# Patient Record
Sex: Male | Born: 1955 | Race: White | Hispanic: No | Marital: Married | Smoking: Never smoker
Health system: Southern US, Community
[De-identification: ages and names within clinical notes are randomized; demographics above are authoritative.]

## PROBLEM LIST (undated history)

## (undated) DIAGNOSIS — F329 Major depressive disorder, single episode, unspecified: Secondary | ICD-10-CM

## (undated) DIAGNOSIS — F429 Obsessive-compulsive disorder, unspecified: Secondary | ICD-10-CM

## (undated) DIAGNOSIS — N4281 Prostatodynia syndrome: Secondary | ICD-10-CM

## (undated) DIAGNOSIS — F419 Anxiety disorder, unspecified: Secondary | ICD-10-CM

## (undated) DIAGNOSIS — F32A Depression, unspecified: Secondary | ICD-10-CM

## (undated) DIAGNOSIS — K7581 Nonalcoholic steatohepatitis (NASH): Secondary | ICD-10-CM

## (undated) DIAGNOSIS — M6281 Muscle weakness (generalized): Secondary | ICD-10-CM

## (undated) DIAGNOSIS — M5416 Radiculopathy, lumbar region: Secondary | ICD-10-CM

## (undated) DIAGNOSIS — G252 Other specified forms of tremor: Secondary | ICD-10-CM

## (undated) DIAGNOSIS — R400 Somnolence: Secondary | ICD-10-CM

## (undated) HISTORY — DX: Nonalcoholic steatohepatitis (NASH): K75.81

## (undated) HISTORY — DX: Obsessive-compulsive disorder, unspecified: F42.9

## (undated) HISTORY — DX: Major depressive disorder, single episode, unspecified: F32.9

## (undated) HISTORY — DX: Depression, unspecified: F32.A

## (undated) HISTORY — DX: Other specified forms of tremor: G25.2

## (undated) HISTORY — DX: Muscle weakness (generalized): M62.81

## (undated) HISTORY — PX: APPENDECTOMY: SHX54

## (undated) HISTORY — DX: Radiculopathy, lumbar region: M54.16

## (undated) HISTORY — DX: Prostatodynia syndrome: N42.81

## (undated) HISTORY — DX: Anxiety disorder, unspecified: F41.9

## (undated) HISTORY — DX: Somnolence: R40.0

## (undated) HISTORY — PX: TONSILLECTOMY AND ADENOIDECTOMY: SUR1326

---

## 1996-03-26 DIAGNOSIS — F429 Obsessive-compulsive disorder, unspecified: Secondary | ICD-10-CM

## 1996-03-26 HISTORY — DX: Obsessive-compulsive disorder, unspecified: F42.9

## 2005-04-05 ENCOUNTER — Ambulatory Visit: Payer: Self-pay

## 2013-09-27 ENCOUNTER — Ambulatory Visit (INDEPENDENT_AMBULATORY_CARE_PROVIDER_SITE_OTHER): Payer: BC Managed Care – PPO | Admitting: Physician Assistant

## 2013-09-27 VITALS — BP 152/92 | HR 100 | Temp 98.1°F | Resp 20 | Ht 67.0 in | Wt 226.8 lb

## 2013-09-27 DIAGNOSIS — H60391 Other infective otitis externa, right ear: Secondary | ICD-10-CM

## 2013-09-27 DIAGNOSIS — R059 Cough, unspecified: Secondary | ICD-10-CM

## 2013-09-27 DIAGNOSIS — H60399 Other infective otitis externa, unspecified ear: Secondary | ICD-10-CM

## 2013-09-27 DIAGNOSIS — F419 Anxiety disorder, unspecified: Secondary | ICD-10-CM

## 2013-09-27 DIAGNOSIS — H6123 Impacted cerumen, bilateral: Secondary | ICD-10-CM

## 2013-09-27 DIAGNOSIS — F329 Major depressive disorder, single episode, unspecified: Secondary | ICD-10-CM | POA: Insufficient documentation

## 2013-09-27 DIAGNOSIS — H6120 Impacted cerumen, unspecified ear: Secondary | ICD-10-CM

## 2013-09-27 DIAGNOSIS — R05 Cough: Secondary | ICD-10-CM

## 2013-09-27 MED ORDER — CIPROFLOXACIN-HYDROCORTISONE 0.2-1 % OT SUSP: 3.0000 [drp] | Freq: Two times a day (BID) | OTIC | Status: AC

## 2013-09-27 MED ORDER — AMOXICILLIN-POT CLAVULANATE 875-125 MG PO TABS
1.0000 | ORAL_TABLET | Freq: Two times a day (BID) | ORAL | Status: AC
Start: 2013-09-27 — End: 2013-10-07

## 2013-09-27 NOTE — Patient Instructions (Signed)
Get plenty of rest and drink at least 64 ounces of water daily. 

## 2013-09-27 NOTE — Progress Notes (Signed)
After verbal consent, colace drops placed in patients left ear, waited 5 minutes. Left ear flushed with peroxide and warm water at 1:1 ratio. Large amount of cerumen removed. Then patients right ear was gently flushed with peroxide and warm water 1:1 ratio. Small amount of white material, and cerumen removed. Patient tolerated procedure well without complaints of pain. Minimal discomfort noted of right ear when using otoscope to check progress of procedure. Patient reported feeling better following procedure, states hearing increased in left ear. Right ear appears red within ear canal.

## 2013-09-27 NOTE — Progress Notes (Signed)
Subjective:    Patient ID: Mark Estrada, male    DOB: 02/18/1956, 58 y.o.   MRN: [id number]   PCP: Dois DavenportICHTER,KAREN L., MD  Chief Complaint  Patient presents with  . Otalgia    right ear is bothering him, has been sick with a cold  . Cough    productive    Medications, allergies, past medical history, surgical history, family history, social history and problem list reviewed and updated.  Patient Active Problem List   Diagnosis Date Noted  . Anxiety and depression 09/27/2013   Prior to Admission medications   Medication Sig Start Date End Date Taking? Authorizing Provider  PARoxetine (PAXIL) 40 MG tablet Take 40 mg by mouth every morning.   Yes Historical Provider, MD    HPI  Started as a chest cold 2-3 weeks ago.   Still coughing up some stuff, but overall is feeling better.  May have had some fever initially, but not since the first day or two. "I assume it's settled into my ear, which it always does." He reports long history of ear issues-cerumen impaction and infections. About the time the chest cold started, he scratched the inside of the ear canal and his fingernail cut the skin. Tried cleaning out the canal with H2O2 a couple of days ago, and notes that he's still having the liquid drain out after 2 days.  Review of Systems     Objective:   Physical Exam  Vitals reviewed. Constitutional: He is oriented to person, place, and time. Vital signs are normal. He appears well-developed and well-nourished. He is active and cooperative. No distress.  BP 152/92  Pulse 100  Temp(Src) 98.1 F (36.7 C) (Oral)  Resp 20  Ht 5\' 7"  (1.702 m)  Wt 226 lb 12.8 oz (102.876 kg)  BMI 35.51 kg/m2  SpO2 96%  HENT:  Head: Normocephalic and atraumatic.  Right Ear: Hearing normal.  Left Ear: Hearing normal.  LEFT ear with cerumen impaction-very dry, hard cerumen fills the canal and completely obscures visualization of the TM.  After irrigation, the canal is clear and TM is  normal in appearance.  RIGHT ear is swollen on inspection, and the external ear is tender.  The canal is swollen and the TM is obscured by soft, yellow cerumen.  Gentle irrigation removed most of the cerumen, revealing erythema and edema of the canal, and a normal TM.  Eyes: Conjunctivae are normal. No scleral icterus.  Neck: Normal range of motion. Neck supple. No thyromegaly present.  Cardiovascular: Normal rate, regular rhythm and normal heart sounds.   Pulses:      Radial pulses are 2+ on the right side, and 2+ on the left side.  Pulmonary/Chest: Effort normal and breath sounds normal.  Lymphadenopathy:       Head (right side): No tonsillar, no preauricular, no posterior auricular and no occipital adenopathy present.       Head (left side): No tonsillar, no preauricular, no posterior auricular and no occipital adenopathy present.    He has no cervical adenopathy.       Right: No supraclavicular adenopathy present.       Left: No supraclavicular adenopathy present.  Neurological: He is alert and oriented to person, place, and time. No sensory deficit.  Skin: Skin is warm, dry and intact. No rash noted. No cyanosis or erythema. Nails show no clubbing.  Psychiatric: He has a normal mood and affect.          Assessment & Plan:  1. Otitis, externa, infective, right Anticipatory guidance provided. RTC if symptoms worsen/persist. - amoxicillin-clavulanate (AUGMENTIN) 875-125 MG per tablet; Take 1 tablet by mouth 2 (two) times daily.  Dispense: 20 tablet; Refill: 0 - ciprofloxacin-hydrocortisone (CIPRO HC OTIC) otic suspension; Place 3 drops into the right ear 2 (two) times daily.  Dispense: 10 mL; Refill: 0  2. Cough Declines treatment.  Mucinex, rest, fluids.  3. Cerumen impaction, bilateral Resolved with irrigation.   Fernande Brashelle S. Abid Bolla, PA-C Physician Assistant-Certified [facility]

## 2013-09-30 NOTE — Addendum Note (Signed)
Addended by: Fernande BrasJEFFERY, Chella Chapdelaine S on: 09/30/2013 08:11 PM   Modules accepted: Level of Service

## 2014-08-01 ENCOUNTER — Ambulatory Visit (INDEPENDENT_AMBULATORY_CARE_PROVIDER_SITE_OTHER): Payer: BC Managed Care – PPO | Admitting: Internal Medicine

## 2014-08-01 VITALS — BP 140/70 | HR 100 | Temp 97.7°F | Resp 17 | Ht 67.0 in | Wt 229.8 lb

## 2014-08-01 DIAGNOSIS — H60392 Other infective otitis externa, left ear: Secondary | ICD-10-CM

## 2014-08-01 MED ORDER — NEOMYCIN-POLYMYXIN-HC 3.5-10000-1 OT SUSP: 3.0000 [drp] | Freq: Four times a day (QID) | OTIC | Status: DC

## 2014-08-01 NOTE — Progress Notes (Signed)
Subjective:  This chart was scribed for Ellamae Siaobert Asael Pann MD, by Veverly FellsHatice Demirci,scribe, at [facility].  This patient was seen in room 1 and the patient's care was started at 9:55 AM.     Patient ID: Mark Estrada, male    DOB: 06/19/1955, 59 y.o.   MRN: [id number] Chief Complaint  Patient presents with  . Otitis Externa    left ear x 1 week  . Ear Pain    x 1week    HPI HPI Comments: Mark Estrada is a 59 y.o. male who [facility] the [facility] complaining of constant left sided ear pain onset a week ago.  He has had similar symptoms in the past and does not ever get pain in his right ear.  He has not been swimming recently. He has no other complaints today.    Past hx EOM  Patient Active Problem List   Diagnosis Date Noted  . Anxiety and depression 09/27/2013    Past Surgical History  Procedure Laterality Date  . Appendectomy    . Tonsillectomy and adenoidectomy     Allergies  Allergen Reactions  . Erythromycin     Itching and hives    Prior to Admission medications   Medication Sig Start Date End Date Taking? Authorizing Provider  PARoxetine (PAXIL) 40 MG tablet Take 40 mg by mouth every morning.   Yes Historical Provider, MD   History   Social History  . Marital Status: Married    Spouse Name: [name]  . Number of Children: 1  . Years of Education: Master's   Occupational History  . History Teacher     [employer]   Social History Main Topics  . Smoking status: Never Smoker   . Smokeless tobacco: Never Used  . Alcohol Use: No  . Drug Use: No  . Sexual Activity: Not on file   Other Topics Concern  . Not on file   Social History Narrative   Lives with his wife and their daughter.       Review of Systems  Constitutional: Negative for fever and chills.  HENT: Positive for ear pain. Negative for facial swelling and hearing loss.   Respiratory: Negative for cough, choking and shortness of  breath.   Gastrointestinal: Negative for nausea and vomiting.  Neurological: Negative for dizziness.       Objective:   Physical Exam  Constitutional: He is oriented to person, place, and time. He appears well-developed and well-nourished. No distress.  HENT:  Head: Normocephalic and atraumatic.  Left EAC has crusting and redness throughout with tender tragus pull. No local adenopathy.   Eyes: Conjunctivae and EOM are normal. Pupils are equal, round, and reactive to light.  Neck: Neck supple.  Lymphadenopathy:    He has no cervical adenopathy.  Neurological: He is alert and oriented to person, place, and time.  Skin: Skin is warm and dry.  Psychiatric: He has a normal mood and affect. His behavior is normal.  Nursing note and vitals reviewed.  Filed Vitals:   08/01/14 0948  BP: 140/70  Pulse: 100  Temp: 97.7 F (36.5 C)  TempSrc: Oral  Resp: 17  Height: 5\' 7"  (1.702 m)  Weight: 229 lb 12.8 oz (104.237 kg)  SpO2: 97%          Assessment & Plan:  I have completed the patient encounter in its entirety as documented by the scribe, with editing by me where necessary. Evalynne Locurto P.  Merla Richesoolittle, M.D.  Otitis, externa, infective, left  Meds ordered this encounter  Medications  . neomycin-polymyxin-hydrocortisone (CORTISPORIN) 3.5-10000-1 otic suspension    Sig: Place 3 drops into the left ear 4 (four) times daily.    Dispense:  10 mL    Refill:  2   Consider prophyl min oil weekly, or vin/h2o p shower

## 2014-12-11 ENCOUNTER — Ambulatory Visit (INDEPENDENT_AMBULATORY_CARE_PROVIDER_SITE_OTHER): Payer: BC Managed Care – PPO | Admitting: Emergency Medicine

## 2014-12-11 ENCOUNTER — Ambulatory Visit (INDEPENDENT_AMBULATORY_CARE_PROVIDER_SITE_OTHER): Payer: BC Managed Care – PPO

## 2014-12-11 VITALS — BP 132/84 | HR 105 | Temp 99.7°F | Resp 16 | Ht 67.0 in | Wt 224.2 lb

## 2014-12-11 DIAGNOSIS — M25571 Pain in right ankle and joints of right foot: Secondary | ICD-10-CM

## 2014-12-11 DIAGNOSIS — E875 Hyperkalemia: Secondary | ICD-10-CM

## 2014-12-11 DIAGNOSIS — M79674 Pain in right toe(s): Secondary | ICD-10-CM

## 2014-12-11 LAB — POCT CBC
Granulocyte percent: 73.9 %G (ref 37–80)
HEMATOCRIT: 52.7 % (ref 43.5–53.7)
Hemoglobin: 16.7 g/dL (ref 14.1–18.1)
LYMPH, POC: 2.2 (ref 0.6–3.4)
MCH, POC: 29.3 pg (ref 27–31.2)
MCHC: 31.7 g/dL — AB (ref 31.8–35.4)
MCV: 92.6 fL (ref 80–97)
MID (cbc): 0.4 (ref 0–0.9)
MPV: 7 fL (ref 0–99.8)
POC GRANULOCYTE: 7.3 — AB (ref 2–6.9)
POC LYMPH %: 22.1 % (ref 10–50)
POC MID %: 4 % (ref 0–12)
Platelet Count, POC: 348 10*3/uL (ref 142–424)
RBC: 5.69 M/uL (ref 4.69–6.13)
RDW, POC: 13.7 %
WBC: 9.9 10*3/uL (ref 4.6–10.2)

## 2014-12-11 LAB — BASIC METABOLIC PANEL WITH GFR
BUN: 12 mg/dL (ref 7–25)
CHLORIDE: 98 mmol/L (ref 98–110)
CO2: 29 mmol/L (ref 20–31)
Calcium: 10.4 mg/dL — ABNORMAL HIGH (ref 8.6–10.3)
Creat: 0.99 mg/dL (ref 0.70–1.33)
GFR, EST NON AFRICAN AMERICAN: 84 mL/min (ref >=60)
GFR, Est African American: >89 mL/min (ref >=60)
GLUCOSE: 99 mg/dL (ref 65–99)
POTASSIUM: 5.4 mmol/L — AB (ref 3.5–5.3)
Sodium: 139 mmol/L (ref 135–146)

## 2014-12-11 LAB — URIC ACID: URIC ACID, SERUM: 7.8 mg/dL (ref 4.0–7.8)

## 2014-12-11 LAB — POCT SEDIMENTATION RATE: POCT SED RATE: 45 mm/hr — AB (ref 0–22)

## 2014-12-11 MED ORDER — PREDNISONE 10 MG PO TABS: ORAL_TABLET | ORAL | Status: DC

## 2014-12-11 MED ORDER — HYDROCODONE-ACETAMINOPHEN 5-325 MG PO TABS: 1.0000 | ORAL_TABLET | Freq: Four times a day (QID) | ORAL | Status: DC | PRN

## 2014-12-11 NOTE — Progress Notes (Signed)
Patient ID: Mark Estrada, male   DOB: 1955/10/22, 59 y.o.   MRN: [id number]    This chart was scribed for Collene Gobble, MD by Charline Bills, ED Scribe. The patient was seen in room 13. Patient's care was started at 11:00 AM.  Chief Complaint:  Chief Complaint  Patient presents with  . Foot Swelling    c/o pain and swelling to the right foot and Big toe x's 3 days  . Foot Pain    HPI: Mark Estrada is a 59 y.o. male who reports to [facility] today complaining of constant, gradually worsening right foot pain onset 3 days ago. Pt states that initially noticed a cramp in his right foot 3 nights ago. No known injury. Pain is exacerbated with bearing weight, flexion and extension. He further reports associated swelling to his right foot and right great toe for the past 3 days. Pt denies personal or family h/o gout. He also denies recent dietary changes. Pt denies tobacco use and reports rare alcohol use x1-2/year.   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    Past Medical History  Diagnosis Date  . Anxiety   . Depression    Past Surgical History  Procedure Laterality Date  . Appendectomy    . Tonsillectomy and adenoidectomy     Social History   Social History  . Marital Status: Married    Spouse Name: [name]  . Number of Children: 1  . Years of Education: Master's   Occupational History  . History Teacher     [employer]   Social History Main Topics  . Smoking status: Never Smoker   . Smokeless tobacco: Never Used  . Alcohol Use: No  . Drug Use: No  . Sexual Activity: Not Asked   Other Topics Concern  . None   Social History Narrative   Lives with his wife and their daughter.   Family History  Problem Relation Age of Onset  . Heart disease Father   . Hyperlipidemia Brother    Allergies  Allergen Reactions  . Erythromycin     Itching and hives    Prior to Admission medications   Medication Sig Start Date End Date Taking?  Authorizing Provider  PARoxetine (PAXIL) 40 MG tablet Take 80 mg by mouth every morning.    Yes Historical Provider, MD     ROS: The patient denies fevers, chills, night sweats, unintentional weight loss, chest pain, palpitations, wheezing, dyspnea on exertion, nausea, vomiting, abdominal pain, dysuria, hematuria, melena, numbness, weakness, or tingling. +arthralgias, +joint swelling  All other systems have been reviewed and were otherwise negative with the exception of those mentioned in the HPI and as above.    PHYSICAL EXAM: Filed Vitals:   12/11/14 1028  BP: 132/84  Pulse: 105  Temp: 99.7 F (37.6 C)  Resp: 16   Body mass index is 35.11 kg/(m^2).   General: Alert, no acute distress HEENT:  Normocephalic, atraumatic, oropharynx patent. Eye: Nonie Hoyer [facility] Cardiovascular:  Regular rate and rhythm, no rubs murmurs or gallops.  No Carotid bruits, radial pulse intact. No pedal edema.  Respiratory: Clear to auscultation bilaterally.  No wheezes, rales, or rhonchi.  No cyanosis, no use of accessory musculature Abdominal: No organomegaly, abdomen is soft and non-tender, positive bowel sounds.  No masses. Musculoskeletal: Gait intact. Exquisite tenderness over the 1st MTP joint. Severe pain with any flexion or extension of joint. Swelling over the joint which extends into the mid foot.  Skin:  No rashes. Neurologic: Facial musculature symmetric. Psychiatric: Patient acts appropriately throughout our interaction. Lymphatic: No cervical or submandibular lymphadenopathy Genitourinary/Anorectal: No acute findings  LABS: No results found for this or any previous visit. Results for orders placed or performed in visit on 12/11/14  POCT CBC  Result Value Ref Range   WBC 9.9 4.6 - 10.2 K/uL   Lymph, poc 2.2 0.6 - 3.4   POC LYMPH PERCENT 22.1 10 - 50 %L   MID (cbc) 0.4 0 - 0.9   POC MID % 4.0 0 - 12 %M   POC Granulocyte 7.3 (A) 2 - 6.9   Granulocyte percent 73.9 37 - 80 %G   RBC 5.69  4.69 - 6.13 M/uL   Hemoglobin 16.7 14.1 - 18.1 g/dL   HCT, POC 09.8 11.9 - 53.7 %   MCV 92.6 80 - 97 fL   MCH, POC 29.3 27 - 31.2 pg   MCHC 31.7 (A) 31.8 - 35.4 g/dL   RDW, POC 14.7 %   Platelet Count, POC 348.0 142 - 424 K/uL   MPV 7.0 0 - 99.8 fL    EKG/XRAY:   Primary read interpreted by Dr. Cleta Alberts at [facility]. There are mild arthritic changes at the first MTP joint   ASSESSMENT/PLAN:     Gross sideeffects, risk and benefits, and alternatives of medications d/w patient. Patient is aware that all medications have potential sideeffects and we are unable to predict every sideeffect or drug-drug interaction that may occur.  Lesle Chris MD 12/11/2014 11:00 AM

## 2014-12-11 NOTE — Patient Instructions (Signed)

## 2014-12-14 NOTE — Addendum Note (Signed)
Addended by: Johnnette Litter on: 12/14/2014 10:34 AM   Modules accepted: Orders

## 2015-04-04 ENCOUNTER — Ambulatory Visit (INDEPENDENT_AMBULATORY_CARE_PROVIDER_SITE_OTHER): Payer: BC Managed Care – PPO | Admitting: Emergency Medicine

## 2015-04-04 VITALS — BP 128/88 | HR 104 | Temp 97.8°F | Resp 18 | Ht 67.0 in | Wt 227.0 lb

## 2015-04-04 DIAGNOSIS — H6122 Impacted cerumen, left ear: Secondary | ICD-10-CM | POA: Diagnosis not present

## 2015-04-04 DIAGNOSIS — J0140 Acute pansinusitis, unspecified: Secondary | ICD-10-CM | POA: Diagnosis not present

## 2015-04-04 DIAGNOSIS — H66009 Acute suppurative otitis media without spontaneous rupture of ear drum, unspecified ear: Secondary | ICD-10-CM | POA: Diagnosis not present

## 2015-04-04 MED ORDER — PSEUDOEPHEDRINE-GUAIFENESIN ER 60-600 MG PO TB12: 1.0000 | ORAL_TABLET | Freq: Two times a day (BID) | ORAL | Status: DC

## 2015-04-04 MED ORDER — AMOXICILLIN-POT CLAVULANATE 875-125 MG PO TABS: 1.0000 | ORAL_TABLET | Freq: Two times a day (BID) | ORAL | Status: DC

## 2015-04-04 NOTE — Progress Notes (Signed)
Subjective:  Patient ID: Mark Estrada, male    DOB: 02/23/1956  Age: 60 y.o. MRN: [id number]  CC: Otalgia   HPI Mark Estrada presents  as a 3 week long history of an upper respiratory infection. He has nasal congestion and nasal discharge and postnasal drip is purulent character. He has sinus pressure and pain in his right ear. He has no drainage. He has decreased hearing in his left ear. He has a cough productive purulent sputum. Has no wheezing or shortness breath. He for chills   History Mark Estrada has a past medical history of Anxiety and Depression.   He has past surgical history that includes Appendectomy and Tonsillectomy and adenoidectomy.   His  family history includes Heart disease in his father; Hyperlipidemia in his brother.  He   reports that he has never smoked. He has never used smokeless tobacco. He reports that he does not drink alcohol or use illicit drugs.  Outpatient Prescriptions Prior to Visit  Medication Sig Dispense Refill  . PARoxetine (PAXIL) 40 MG tablet Take 80 mg by mouth every morning.     Marland Kitchen. HYDROcodone-acetaminophen (NORCO) 5-325 MG per tablet Take 1 tablet by mouth every 6 (six) hours as needed for moderate pain. (Patient not taking: Reported on 04/04/2015) 12 tablet 0  . predniSONE (DELTASONE) 10 MG tablet Take 4 a day for 3 days, take 3 a day for 3 days, take 2 a day for 3 days, take 1 a day for 3 days. (Patient not taking: Reported on 04/04/2015) 30 tablet 0   No facility-administered medications prior to visit.    Social History   Social History  . Marital Status: Married    Spouse Name: Mark Estrada  . Number of Children: 1  . Years of Education: Master's   Occupational History  . History Teacher     [employer]   Social History Main Topics  . Smoking status: Never Smoker   . Smokeless tobacco: Never Used  . Alcohol Use: No  . Drug Use: No  . Sexual Activity: Not Asked   Other Topics Concern  . None   Social  History Narrative   Lives with his wife and their daughter.     Review of Systems  Constitutional: Negative for fever, chills and appetite change.  HENT: Positive for congestion, ear pain, postnasal drip, rhinorrhea and sinus pressure. Negative for sore throat.   Eyes: Negative for pain and redness.  Respiratory: Positive for cough. Negative for shortness of breath and wheezing.   Cardiovascular: Negative for leg swelling.  Gastrointestinal: Negative for nausea, vomiting, abdominal pain, diarrhea, constipation and blood in stool.  Endocrine: Negative for polyuria.  Genitourinary: Negative for dysuria, urgency, frequency and flank pain.  Musculoskeletal: Negative for gait problem.  Skin: Negative for rash.  Neurological: Negative for weakness and headaches.  Psychiatric/Behavioral: Negative for confusion and decreased concentration. The patient is not nervous/anxious.     Objective:  BP 128/88 mmHg  Pulse 104  Temp(Src) 97.8 F (36.6 C) (Oral)  Resp 18  Ht 5\' 7"  (1.702 m)  Wt 227 lb (102.967 kg)  BMI 35.55 kg/m2  SpO2 95%  Physical Exam  Constitutional: He is oriented to person, place, and time. He appears well-developed and well-nourished. No distress.  HENT:  Head: Normocephalic and atraumatic.  Right Ear: There is drainage. A middle ear effusion is present.  Left Ear: External ear normal. A foreign body is present.  Nose: Nose normal.  Eyes: Conjunctivae and EOM are normal.  Pupils are equal, round, and reactive to light. No scleral icterus.  Neck: Normal range of motion. Neck supple. No tracheal deviation present.  Cardiovascular: Normal rate, regular rhythm and normal heart sounds.   Pulmonary/Chest: Effort normal. No respiratory distress. He has no wheezes. He has no rales.  Abdominal: He exhibits no mass. There is no tenderness. There is no rebound and no guarding.  Musculoskeletal: He exhibits no edema.  Lymphadenopathy:    He has no cervical adenopathy.    Neurological: He is alert and oriented to person, place, and time. Coordination normal.  Skin: Skin is warm and dry. No rash noted.  Psychiatric: He has a normal mood and affect. His behavior is normal.    His left cerumen impaction was irrigated clear and the moderate amount of wax in his right ear was irrigated to  Assessment & Plan:   Reason was seen today for otalgia.  Diagnoses and all orders for this visit:  Acute pansinusitis, recurrence not specified  Acute suppurative otitis media without spontaneous rupture of ear drum, recurrence not specified, unspecified laterality  Cerumen impaction, left  Other orders -     amoxicillin-clavulanate (AUGMENTIN) 875-125 MG tablet; Take 1 tablet by mouth 2 (two) times daily. -     pseudoephedrine-guaifenesin (MUCINEX D) 60-600 MG 12 hr tablet; Take 1 tablet by mouth every 12 (twelve) hours.  I am having Mark Estrada start on amoxicillin-clavulanate and pseudoephedrine-guaifenesin. I am also having him maintain his PARoxetine, predniSONE, and HYDROcodone-acetaminophen.  Meds ordered this encounter  Medications  . amoxicillin-clavulanate (AUGMENTIN) 875-125 MG tablet    Sig: Take 1 tablet by mouth 2 (two) times daily.    Dispense:  20 tablet    Refill:  0  . pseudoephedrine-guaifenesin (MUCINEX D) 60-600 MG 12 hr tablet    Sig: Take 1 tablet by mouth every 12 (twelve) hours.    Dispense:  18 tablet    Refill:  0    Appropriate red flag conditions were discussed with the patient as well as actions that should be taken.  Patient expressed his understanding.  Follow-up: No Follow-up on file.  Carmelina Dane, MD

## 2015-04-26 ENCOUNTER — Ambulatory Visit (INDEPENDENT_AMBULATORY_CARE_PROVIDER_SITE_OTHER): Payer: BC Managed Care – PPO | Admitting: Family Medicine

## 2015-04-26 VITALS — BP 152/100 | HR 100 | Temp 98.2°F | Resp 19 | Ht 67.0 in | Wt 222.6 lb

## 2015-04-26 DIAGNOSIS — F429 Obsessive-compulsive disorder, unspecified: Secondary | ICD-10-CM | POA: Diagnosis not present

## 2015-04-26 DIAGNOSIS — K625 Hemorrhage of anus and rectum: Secondary | ICD-10-CM | POA: Diagnosis not present

## 2015-04-26 DIAGNOSIS — F411 Generalized anxiety disorder: Secondary | ICD-10-CM

## 2015-04-26 MED ORDER — LORAZEPAM 1 MG PO TABS: 1.0000 mg | ORAL_TABLET | Freq: Two times a day (BID) | ORAL | Status: DC | PRN

## 2015-04-26 NOTE — Progress Notes (Signed)
Patient ID: Mark Estrada, male    DOB: 07/09/1955  Age: 60 y.o. MRN: [id number]  Chief Complaint  Patient presents with  . Rectal Pain    bleeding, started yesterday    Subjective:   Patient had some blood streaking on his stool yesterday. There is more today. He had a single episode of this about 8 years ago but none since. He has never had a colonoscopy. He has excessive compulsive disorder and is very very anxious over this right now.  Current allergies, medications, problem list, past/family and social histories reviewed.  Objective:  BP 152/100 mmHg  Pulse 100  Temp(Src) 98.2 F (36.8 C) (Oral)  Resp 19  Ht  (1.702 m)  Wt 222 lb 9.6 oz (100.971 kg)  BMI 34.86 kg/m2  SpO2 96%  No acute distress. Abdomen has normal bowel sounds, soft without organomegaly, masses, or tenderness. Anal exam appears normal externally with no fissures noted. On digital exam feel 1 small internal hemorrhoid.  Anoscopy: The endoscope was used for a exam up in his rectum. There is bright red blood and some edematous-looking folds but I cannot tell where the blood is coming from. Patient tolerated the procedure well. Large Q-tip was used to sponge some blood out, but still could not see exactly where the bleeding was coming from.  Assessment & Plan:   Assessment: 1. BRBPR (bright red blood per rectum)   2. Anxiety state   3. OCD (obsessive compulsive disorder)       Plan: Spoke with Dr. Loreta Ave who will see the patient now.  Orders Placed This Encounter  Procedures  . Ambulatory referral to Gastroenterology    Referral Priority:  Routine    Referral Type:  Consultation    Referral Reason:  Specialty Services Required    Referred to Provider:  Charna Elizabeth, MD    Requested Specialty:  Gastroenterology    Number of Visits Requested:  1    Meds ordered this encounter  Medications  . LORazepam (ATIVAN) 1 MG tablet    Sig: Take 1 tablet (1 mg total) by mouth 2 (two) times daily as  needed for anxiety.    Dispense:  20 tablet    Refill:  1         Patient Instructions  Go on over to see Dr. Charna Elizabeth at [address]  [phone]. She will see you today and make arrangements.  Take the lorazepam 1 twice daily as needed for additional anxiety     Return if symptoms worsen or fail to improve.   HOPPER,DAVID, MD 04/26/2015

## 2015-04-26 NOTE — Patient Instructions (Addendum)
Go on over to see Dr. Charna Elizabeth at [address]  [phone]. She will see you today and make arrangements.  Take the lorazepam 1 twice daily as needed for additional anxiety

## 2015-05-11 ENCOUNTER — Encounter: Payer: Self-pay | Admitting: Family Medicine

## 2016-01-19 ENCOUNTER — Ambulatory Visit (INDEPENDENT_AMBULATORY_CARE_PROVIDER_SITE_OTHER): Payer: BC Managed Care – PPO | Admitting: Physician Assistant

## 2016-01-19 DIAGNOSIS — H6122 Impacted cerumen, left ear: Secondary | ICD-10-CM | POA: Insufficient documentation

## 2016-01-19 NOTE — Patient Instructions (Signed)
     IF you received an x-ray today, you will receive an invoice from [facility]. Please contact [facility] at [phone] with questions or concerns regarding your invoice.   IF you received labwork today, you will receive an invoice from [facility]. Please contact [facility] at [phone] with questions or concerns regarding your invoice.   Our billing staff will not be able to assist you with questions regarding bills from these companies.  You will be contacted with the lab results as soon as they are available. The fastest way to get your results is to activate your My Chart account. Instructions are located on the last page of this paperwork. If you have not heard from us regarding the results in 2 weeks, please contact this office.      

## 2016-01-19 NOTE — Progress Notes (Signed)
Subjective:    Patient ID: Mark Estrada, male    DOB: [birth year], 60 y.o.   MRN: [id number]  Patient Care Team: Ethelda Chick, MD as PCP - [facility])  Chief Complaint  Patient presents with  . Ear Problem    Left. x2days   HPI: Presents for hearing loss and cerumen impaction of his left ear. States he does not have pain associated with this but last night when he took a shower he noticed something "shifted" in his ear and since then has been having to ask people repeat things today. Associated symptoms include some mild itching. States his right ear may be impacted as well but has not noticed hearing loss on that side. Tried using hydrogen peroxide at home without relief. Notes recent congestion, cough with sputum, and sneezing over the past few weeks which he attributes to his typical "cold" around this time of year. Denies sore throat, chest pain, SOB, wheezing, chest tightness, fever, or chills. Denies eye itching/redness/discharge. Denies discharge from ears. Endorses history of cerumen impaction which can sometimes be relieved by home remedies. States he had otitis externa last year and this feels nothing like that.   Review of Systems  Constitutional: Negative for chills and fever.  HENT: Positive for congestion, hearing loss, sneezing and tinnitus. Negative for ear discharge, ear pain, postnasal drip, rhinorrhea, sinus pressure and sore throat.   Eyes: Negative for pain, discharge, redness and itching.  Respiratory: Positive for cough. Negative for chest tightness, shortness of breath and wheezing.   Cardiovascular: Negative for chest pain and palpitations.  Gastrointestinal: Negative for abdominal pain, blood in stool, constipation, diarrhea, nausea and vomiting.  Genitourinary: Negative for dysuria and hematuria.      Allergies  Allergen Reactions  . Erythromycin     Itching and hives    Prior to Admission medications   Medication Sig Start Date End  Date Taking? Authorizing Provider  PARoxetine (PAXIL) 40 MG tablet Take 80 mg by mouth every morning. Reported on 04/26/2015   Yes Historical Provider, MD   Patient Active Problem List   Diagnosis Date Noted  . Hearing loss due to cerumen impaction, left 01/19/2016  . Anxiety and depression 09/27/2013     Objective:   Physical Exam Blood pressure (!) 140/92, pulse (!) 110, temperature 98.3 F (36.8 C), temperature source Oral, resp. rate 17, height 5\' 7"  (1.702 m), weight 225 lb (102.1 kg), SpO2 97 %. General: Well-developed, well-nourished, appears stated age and in no apparent distress. HEENT:  Head: Normocephalic, atraumatic Eyes: PERRLA, sclera and conjunctiva clear without injection or icterus. Ears: Bilateral canals with cerumen impaction. Weber test with lateralization to the right ear. Bone conduction > air conduction in left ear on Rinne test. Air conduction > bone conduction in right ear on Rinne test. Nose: Patent. Mucosa deep pink, glistening with no discharge, masses or lesions. No septum perforations, exudates, or polyps. Throat: Mouth and throat non-erythematous without evidence of tonsillar hypertrophy or exudate. No cobble stoning. Neck: Supple. No thyromegaly or lymphadenopathy. No tracheal deviations, JVD, or carotid bruits. Pulmonary: Clear to auscultation bilaterally, no wheezes, rhonchi, or rales. No cyanosis or clubbing. Cardiovascular: Regular rate and rhythm with normal S1 and S2 without murmurs, rubs, or gallops. *Of note: patient's pulse tachycardic in vitals but noted to be regular rate and rhythm during exam. Neurological: Awake, alert, oriented.  Skin: Skin warm and dry. No rashes noted. Psychiatric: Appropriate mood and affect. Fluent speech and normal behavior.  Ear irrigation  with water and hydrogen peroxide performed by CMA. Patient tolerated procedure well and significant amount of cerumen irrigated from bilateral ears. Patient stated hearing much  improved immediately following procedure. Ears inspected following irrigation. Bilateral TMs visualized, pearly grey, bony landmarks and cone of light visualized. Mild erythema noted in TM of left ear but non-bulging, no retraction, non-opaque, and no fluid.      Assessment & Plan:  1. Hearing loss due to cerumen impaction, left Cerumen removed in clinic with immediate relief and return of hearing from patient. Instructed patient to RTC if worsening respiratory symptoms or continued hearing loss or ear pain.

## 2016-01-19 NOTE — Progress Notes (Signed)
   Patient ID: Mark GirtLawrence Siefert, male    DOB: 10/10/1955, 60 y.o.   MRN: [id number]  PCP: Nilda SimmerSMITH,KRISTI, MD  Subjective:   Chief Complaint  Patient presents with  . Ear Problem    Left. x2days    HPI Presents for evaluation of presumed cerumen impaction in the LEFT ear.  Last night in the shower something "shifted" and reports that his hearing has reduced. Suspects that the RIGHT ear is also impacted, though it doesn't feel as bad. H2O2 has not helped. Some recent URI-type symptoms x several weeks. No ear pain. He has a history of recurrent cerumen impaction. This doesn't feel like the AOM he had last year.    Review of Systems Constitutional: Negative for chills and fever.  HENT: Positive for congestion, hearing loss, sneezing and tinnitus. Negative for ear discharge, ear pain, postnasal drip, rhinorrhea, sinus pressure and sore throat.   Eyes: Negative for pain, discharge, redness and itching.  Respiratory: Positive for cough. Negative for chest tightness, shortness of breath and wheezing.   Cardiovascular: Negative for chest pain and palpitations.  Gastrointestinal: Negative for abdominal pain, blood in stool, constipation, diarrhea, nausea and vomiting.  Genitourinary: Negative for dysuria and hematuria.     Patient Active Problem List   Diagnosis Date Noted  . Hearing loss due to cerumen impaction, left 01/19/2016  . Anxiety and depression 09/27/2013     Prior to Admission medications   Medication Sig Start Date End Date Taking? Authorizing Provider  PARoxetine (PAXIL) 40 MG tablet Take 80 mg by mouth every morning. Reported on 04/26/2015   Yes Historical Provider, MD     Allergies  Allergen Reactions  . Erythromycin     Itching and hives        Objective:  Physical Exam  Constitutional: He is oriented to person, place, and time. He appears well-developed and well-nourished. He is active and cooperative. No distress.  BP (!) 140/92 (BP Location: Right  Arm, Patient Position: Sitting, Cuff Size: Large)   Pulse (!) 110   Temp 98.3 F (36.8 C) (Oral)   Resp 17   Ht 5\' 7"  (1.702 m)   Wt 225 lb (102.1 kg)   SpO2 97%   BMI 35.24 kg/m    HENT:  Head: Normocephalic and atraumatic.  Nose: Nose normal.  Mouth/Throat: Oropharynx is clear and moist and mucous membranes are normal.  Both canals with impacted cerumen. Resolved with irrigation, revealing normal canals and TMs.  Eyes: Conjunctivae are normal.  Pulmonary/Chest: Effort normal.  Neurological: He is alert and oriented to person, place, and time.  Psychiatric: He has a normal mood and affect. His speech is normal and behavior is normal.           Assessment & Plan:   1. Hearing loss due to cerumen impaction, left Resolved. - Ear wax removal   Fernande Brashelle S. Shilo Pauwels, PA-C Physician Assistant-Certified [facility]

## 2016-01-25 ENCOUNTER — Encounter: Payer: Self-pay | Admitting: Physician Assistant

## 2016-10-02 ENCOUNTER — Ambulatory Visit (INDEPENDENT_AMBULATORY_CARE_PROVIDER_SITE_OTHER): Payer: BC Managed Care – PPO | Admitting: Urgent Care

## 2016-10-02 ENCOUNTER — Encounter: Payer: Self-pay | Admitting: Urgent Care

## 2016-10-02 VITALS — BP 156/121 | HR 103 | Temp 98.5°F | Resp 18 | Ht 67.0 in | Wt 218.0 lb

## 2016-10-02 DIAGNOSIS — H9203 Otalgia, bilateral: Secondary | ICD-10-CM | POA: Diagnosis not present

## 2016-10-02 DIAGNOSIS — I10 Essential (primary) hypertension: Secondary | ICD-10-CM | POA: Diagnosis not present

## 2016-10-02 DIAGNOSIS — R030 Elevated blood-pressure reading, without diagnosis of hypertension: Secondary | ICD-10-CM

## 2016-10-02 DIAGNOSIS — Z8249 Family history of ischemic heart disease and other diseases of the circulatory system: Secondary | ICD-10-CM | POA: Diagnosis not present

## 2016-10-02 DIAGNOSIS — Z8669 Personal history of other diseases of the nervous system and sense organs: Secondary | ICD-10-CM

## 2016-10-02 DIAGNOSIS — J3089 Other allergic rhinitis: Secondary | ICD-10-CM

## 2016-10-02 MED ORDER — AMOXICILLIN 875 MG PO TABS: 875.0000 mg | ORAL_TABLET | Freq: Two times a day (BID) | ORAL | 0 refills | Status: DC

## 2016-10-02 MED ORDER — PSEUDOEPHEDRINE HCL 60 MG PO TABS: 60.0000 mg | ORAL_TABLET | Freq: Two times a day (BID) | ORAL | 0 refills | Status: DC | PRN

## 2016-10-02 MED ORDER — LISINOPRIL-HYDROCHLOROTHIAZIDE 10-12.5 MG PO TABS: 1.0000 | ORAL_TABLET | Freq: Every day | ORAL | 1 refills | Status: DC

## 2016-10-02 MED ORDER — CETIRIZINE HCL 10 MG PO TABS: 10.0000 mg | ORAL_TABLET | Freq: Every day | ORAL | 11 refills | Status: DC

## 2016-10-02 NOTE — Progress Notes (Signed)
  MRN: [id number] DOB: 12/02/1955  Subjective:   Mark Estrada is a 61 y.o. male presenting for chief complaint of Ear Pain (bilateral) and Hypertension (Today. PER PT he has been taking a lot of Sudafed & other decongestants)  Reports 3 week history of persistent bilateral ear pain. Has tinnitus, ear popping, left-sided lymph node pain. Has also had nasal congestion. Uses Flonase for allergies, also Sudafed consistently. Denies fever, ear drainage, dizziness, sinus pain, sore throat, cough. Denies smoking cigarettes. Denies history of HTN. Also denies chronic headache, blurred vision, chest pain, shortness of breath, heart racing, palpitations, nausea, vomiting, abdominal pain, hematuria, lower leg swelling.   Mark Estrada has a current medication list which includes the following prescription(s): fluticasone, paroxetine, and pseudoephedrine hcl. Also is allergic to erythromycin. Mark Estrada  has a past medical history of Anxiety; Depression; and OCD (obsessive compulsive disorder) (1998). Also  has a past surgical history that includes Appendectomy and Tonsillectomy and adenoidectomy.  Objective:   Vitals: BP (!) 156/121   Pulse (!) 103   Temp 98.5 F (36.9 C) (Oral)   Resp 18   Ht 5\' 7"  (1.702 m)   Wt 218 lb (98.9 kg)   SpO2 97%   BMI 34.14 kg/m   BP Readings from Last 3 Encounters:  10/02/16 (!) 156/121  01/19/16 (!) 140/92  04/26/15 (!) 152/100   Physical Exam  Constitutional: He is oriented to person, place, and time. He appears well-developed and well-nourished.  HENT:  TM's flat but intact, effusions bilaterally. Nasal turbinates erythematous, nasal passages patent. No sinus tenderness. Oropharynx with post-nasal drainage in cobblestone pattern, mucous membranes moist.  Eyes: Right eye exhibits no discharge. Left eye exhibits no discharge. No scleral icterus.  Neck: Normal range of motion. Neck supple. No thyromegaly present.  Cardiovascular: Normal rate, regular rhythm and  intact distal pulses.  Exam reveals no gallop and no friction rub.   No murmur heard. Pulmonary/Chest: No respiratory distress. He has no wheezes. He has no rales.  Abdominal: Soft. Bowel sounds are normal. He exhibits no distension and no mass. There is no tenderness. There is no guarding.  Lymphadenopathy:    He has no cervical adenopathy.  Neurological: He is alert and oriented to person, place, and time.  Skin: Skin is warm and dry.  Psychiatric: He has a normal mood and affect.   Assessment and Plan :   1. Non-seasonal allergic rhinitis due to other allergic trigger 2. Acute ear pain, bilateral 3. History of ear infections - Will cover for infectious process with amoxicillin likely secondary to allergic rhinitis. Stop Flonase until better. Decrease Sudafed use to 60mg  BID prn. Return-to-clinic precautions discussed, patient verbalized understanding.   4. Essential hypertension 5. Elevated blood pressure reading 6. Family history of myocardial infarction at age less than 4860 - Counseled on diagnosis. Start HCTZ-lis. Recommended dietary modifications, consistent exercise. Recheck in 4 weeks. Return-to-clinic precautions discussed, patient verbalized understanding.   Wallis BambergMario Elener Custodio, PA-C [facility] at [facility] [phone] 10/02/2016  1:29 PM

## 2016-10-02 NOTE — Patient Instructions (Addendum)
For seasoning for meals use Mrs. Dash.   Salads - kale, spinach, cabbage, spring mix; use seeds like pumpkin seeds or sunflower seeds; you can also use 1-2 hard boiled eggs. Fruits - avocadoes, berries (blueberries, raspberries, blackberries), apples, oranges, pomegranate, grapefruit Seeds - quinoa, chia seeds; you can also incorporate oatmeal Vegetables - aspargus, cauliflower, broccoli, green beans, brussel spouts, bell peppers; stay away from starchy vegetables like potatoes, carrots, peas  Brussel sprouts - Cut off stems. Place in a mixing bowl that has a lid. Pour in a 1/4-1/2 cup olive oil, spices, use a light amount of parmesan. Place on a baking sheet. Bake for 10 minutes at 400F. Take it out, eat the brussel chips. Place for another 5-10 minutes.   Mashed cauliflower - Boil a bunch of cauliflower in a pot of water. Blend in a food processor with 1-2 tablespoons of butter.  Spaghetti squash -  Cut the squash in half very carefully, clean out seeds from the middle. Place 1/2 face down in a microwave safe dish with at least 2 inches of water. Make 4-6 slits on outside of spaghetti squash and microwave for 10-12 minutes. Take out the spaghetti using a metal spoon. Repeat for the other half.   Vega protein is good protein powder, make sure you use ~6 ice cubes to give it smoothie consistency together with ~4-6 ounces of vanilla soy milk. Throw cinnamon into your shake, use peanut butter. You can also use the fruits that I listed above. Throw spinach or kale into the shake.      Hypertension Hypertension, commonly called high blood pressure, is when the force of blood pumping through the arteries is too strong. The arteries are the blood vessels that carry blood from the heart throughout the body. Hypertension forces the heart to work harder to pump blood and may cause arteries to become narrow or stiff. Having untreated or uncontrolled hypertension can cause heart attacks, strokes, kidney  disease, and other problems. A blood pressure reading consists of a higher number over a lower number. Ideally, your blood pressure should be below 120/80. The first ("top") number is called the systolic pressure. It is a measure of the pressure in your arteries as your heart beats. The second ("bottom") number is called the diastolic pressure. It is a measure of the pressure in your arteries as the heart relaxes. What are the causes? The cause of this condition is not known. What increases the risk? Some risk factors for high blood pressure are under your control. Others are not. Factors you can change  Smoking.  Having type 2 diabetes mellitus, high cholesterol, or both.  Not getting enough exercise or physical activity.  Being overweight.  Having too much fat, sugar, calories, or salt (sodium) in your diet.  Drinking too much alcohol. Factors that are difficult or impossible to change  Having chronic kidney disease.  Having a family history of high blood pressure.  Age. Risk increases with age.  Race. You may be at higher risk if you are African-American.  Gender. Men are at higher risk than women before age 60. After age 265, women are at higher risk than men.  Having obstructive sleep apnea.  Stress. What are the signs or symptoms? Extremely high blood pressure (hypertensive crisis) may cause:  Headache.  Anxiety.  Shortness of breath.  Nosebleed.  Nausea and vomiting.  Severe chest pain.  Jerky movements you cannot control (seizures).  How is this diagnosed? This condition is diagnosed by measuring  your blood pressure while you are seated, with your arm resting on a surface. The cuff of the blood pressure monitor will be placed directly against the skin of your upper arm at the level of your heart. It should be measured at least twice using the same arm. Certain conditions can cause a difference in blood pressure between your right and left arms. Certain  factors can cause blood pressure readings to be lower or higher than normal (elevated) for a short period of time:  When your blood pressure is higher when you are in a health care provider's office than when you are at home, this is called white coat hypertension. Most people with this condition do not need medicines.  When your blood pressure is higher at home than when you are in a health care provider's office, this is called masked hypertension. Most people with this condition may need medicines to control blood pressure.  If you have a high blood pressure reading during one visit or you have normal blood pressure with other risk factors:  You may be asked to return on a different day to have your blood pressure checked again.  You may be asked to monitor your blood pressure at home for 1 week or longer.  If you are diagnosed with hypertension, you may have other blood or imaging tests to help your health care provider understand your overall risk for other conditions. How is this treated? This condition is treated by making healthy lifestyle changes, such as eating healthy foods, exercising more, and reducing your alcohol intake. Your health care provider may prescribe medicine if lifestyle changes are not enough to get your blood pressure under control, and if:  Your systolic blood pressure is above 130.  Your diastolic blood pressure is above 80.  Your personal target blood pressure may vary depending on your medical conditions, your age, and other factors. Follow these instructions at home: Eating and drinking  Eat a diet that is high in fiber and potassium, and low in sodium, added sugar, and fat. An example eating plan is called the DASH (Dietary Approaches to Stop Hypertension) diet. To eat this way: ? Eat plenty of fresh fruits and vegetables. Try to fill half of your plate at each meal with fruits and vegetables. ? Eat whole grains, such as whole wheat pasta, brown rice, or  whole grain bread. Fill about one quarter of your plate with whole grains. ? Eat or drink low-fat dairy products, such as skim milk or low-fat yogurt. ? Avoid fatty cuts of meat, processed or cured meats, and poultry with skin. Fill about one quarter of your plate with lean proteins, such as fish, chicken without skin, beans, eggs, and tofu. ? Avoid premade and processed foods. These tend to be higher in sodium, added sugar, and fat.  Reduce your daily sodium intake. Most people with hypertension should eat less than 1,500 mg of sodium a day.  Limit alcohol intake to no more than 1 drink a day for nonpregnant women and 2 drinks a day for men. One drink equals 12 oz of beer, 5 oz of wine, or 1 oz of hard liquor. Lifestyle  Work with your health care provider to maintain a healthy body weight or to lose weight. Ask what an ideal weight is for you.  Get at least 30 minutes of exercise that causes your heart to beat faster (aerobic exercise) most days of the week. Activities may include walking, swimming, or biking.  Include exercise  to strengthen your muscles (resistance exercise), such as pilates or lifting weights, as part of your weekly exercise routine. Try to do these types of exercises for 30 minutes at least 3 days a week.  Do not use any products that contain nicotine or tobacco, such as cigarettes and e-cigarettes. If you need help quitting, ask your health care provider.  Monitor your blood pressure at home as told by your health care provider.  Keep all follow-up visits as told by your health care provider. This is important. Medicines  Take over-the-counter and prescription medicines only as told by your health care provider. Follow directions carefully. Blood pressure medicines must be taken as prescribed.  Do not skip doses of blood pressure medicine. Doing this puts you at risk for problems and can make the medicine less effective.  Ask your health care provider about side  effects or reactions to medicines that you should watch for. Contact a health care provider if:  You think you are having a reaction to a medicine you are taking.  You have headaches that keep coming back (recurring).  You feel dizzy.  You have swelling in your ankles.  You have trouble with your vision. Get help right away if:  You develop a severe headache or confusion.  You have unusual weakness or numbness.  You feel faint.  You have severe pain in your chest or abdomen.  You vomit repeatedly.  You have trouble breathing. Summary  Hypertension is when the force of blood pumping through your arteries is too strong. If this condition is not controlled, it may put you at risk for serious complications.  Your personal target blood pressure may vary depending on your medical conditions, your age, and other factors. For most people, a normal blood pressure is less than 120/80.  Hypertension is treated with lifestyle changes, medicines, or a combination of both. Lifestyle changes include weight loss, eating a healthy, low-sodium diet, exercising more, and limiting alcohol. This information is not intended to replace advice given to you by your health care provider. Make sure you discuss any questions you have with your health care provider. Document Released: 03/12/2005 Document Revised: 02/08/2016 Document Reviewed: 02/08/2016 Elsevier Interactive Patient Education  2018 [employer].    IF you received an x-ray today, you will receive an invoice from [facility]. Please contact [facility] at [phone] with questions or concerns regarding your invoice.   IF you received labwork today, you will receive an invoice from [place]. Please contact [facility] at [phone] with questions or concerns regarding your invoice.   Our billing staff will not be able to assist you with questions regarding bills from these companies.  You will be contacted with the  lab results as soon as they are available. The fastest way to get your results is to activate your My Chart account. Instructions are located on the last page of this paperwork. If you have not heard from [place] regarding the results in 2 weeks, please contact this office.

## 2016-10-04 ENCOUNTER — Ambulatory Visit (INDEPENDENT_AMBULATORY_CARE_PROVIDER_SITE_OTHER): Payer: BC Managed Care – PPO | Admitting: Physician Assistant

## 2016-10-04 ENCOUNTER — Encounter: Payer: Self-pay | Admitting: Physician Assistant

## 2016-10-04 VITALS — BP 144/88 | HR 83 | Temp 97.8°F | Resp 18 | Ht 66.54 in | Wt 216.0 lb

## 2016-10-04 DIAGNOSIS — I10 Essential (primary) hypertension: Secondary | ICD-10-CM

## 2016-10-04 NOTE — Patient Instructions (Addendum)
I will contact you with your lab results.      IF you received an x-ray today, you will receive an invoice from [facility]. Please contact [facility] at [phone] with questions or concerns regarding your invoice.   IF you received labwork today, you will receive an invoice from [facility]. Please contact [facility] at [phone] with questions or concerns regarding your invoice.   Our billing staff will not be able to assist you with questions regarding bills from these companies.  You will be contacted with the lab results as soon as they are available. The fastest way to get your results is to activate your My Chart account. Instructions are located on the last page of this paperwork. If you have not heard from us regarding the results in 2 weeks, please contact this office.

## 2016-10-04 NOTE — Progress Notes (Signed)
[facility] [address], [place] [phone]  Date:  10/04/2016   Name:  Mark GirtLawrence Estrada   DOB:  08/16/1955   MRN:  [id number]  PCP:  Ethelda ChickSmith, Kristi M, MD    History of Present Illness:  Mark Estrada is a 61 y.o. male patient who presents to PCP with  Chief Complaint  Patient presents with  . Hyperlipidemia    would like to have cholesterol checked      He does not check his blood pressure 2 days.  He is changing his frozen dinners.   2009 his lipids were checked, and around 290 cholesterol.  He was started on a statin, and stopped due to myalgias.   Patient was seen here about 2 days ago for ear pain, which was treated with amoxicillin. He states that the ear is feeling improvement already.   Wt Readings from Last 3 Encounters:  10/04/16 216 lb (98 kg)  10/02/16 218 lb (98.9 kg)  01/19/16 225 lb (102.1 kg)     Patient Active Problem List   Diagnosis Date Noted  . Anxiety and depression 09/27/2013    Past Medical History:  Diagnosis Date  . Anxiety   . Depression   . OCD (obsessive compulsive disorder) 1998    Past Surgical History:  Procedure Laterality Date  . APPENDECTOMY    . TONSILLECTOMY AND ADENOIDECTOMY      Social History  Substance Use Topics  . Smoking status: Never Smoker  . Smokeless tobacco: Never Used  . Alcohol use No    Family History  Problem Relation Age of Onset  . Heart disease Father   . Hyperlipidemia Brother     Allergies  Allergen Reactions  . Erythromycin     Itching and hives     Medication list has been reviewed and updated.  Current Outpatient Prescriptions on File Prior to Visit  Medication Sig Dispense Refill  . amoxicillin (AMOXIL) 875 MG tablet Take 1 tablet (875 mg total) by mouth 2 (two) times daily. 20 tablet 0  . cetirizine (ZYRTEC) 10 MG tablet Take 1 tablet (10 mg total) by mouth daily. 30 tablet 11  . lisinopril-hydrochlorothiazide (PRINZIDE,ZESTORETIC) 10-12.5 MG  tablet Take 1 tablet by mouth daily. 90 tablet 1  . PARoxetine (PAXIL) 40 MG tablet Take 80 mg by mouth every morning. Reported on 04/26/2015     No current facility-administered medications on file prior to visit.     ROS ROS otherwise unremarkable unless listed above.  Physical Examination: BP (!) 144/88   Pulse 83   Temp 97.8 F (36.6 C) (Oral)   Resp 18   Ht 5' 6.53" (1.69 m)   Wt 216 lb (98 kg)   SpO2 98%   BMI 34.30 kg/m  Ideal Body Weight: Weight in (lb) to have BMI = 25: 157.1  Physical Exam  Constitutional: He is oriented to person, place, and time. He appears well-developed and well-nourished. No distress.  HENT:  Head: Normocephalic and atraumatic.  Eyes: Pupils are equal, round, and reactive to light. Conjunctivae and EOM are normal.  Cardiovascular: Normal rate, regular rhythm and normal heart sounds.  Exam reveals no friction rub.   No murmur heard. Pulmonary/Chest: Effort normal and breath sounds normal. No respiratory distress. He has no wheezes.  Neurological: He is alert and oriented to person, place, and time.  Skin: Skin is warm and dry. He is not diaphoretic.  Psychiatric: He has a normal mood and affect. His behavior is  normal.     Assessment and Plan: Mark Estrada is a 61 y.o. male who is here today for cholesterol check Labs pending, will treat with results.   Essential hypertension - Plan: Lipid panel  Trena Platt, PA-C [facility] 7/12/20182:07 PM

## 2016-10-05 LAB — LIPID PANEL
CHOL/HDL RATIO: 9.9 ratio — AB (ref 0.0–5.0)
Cholesterol, Total: 326 mg/dL — ABNORMAL HIGH (ref 100–199)
HDL: 33 mg/dL — AB (ref >39)
LDL Calculated: 245 mg/dL — ABNORMAL HIGH (ref 0–99)
Triglycerides: 239 mg/dL — ABNORMAL HIGH (ref 0–149)
VLDL Cholesterol Cal: 48 mg/dL — ABNORMAL HIGH (ref 5–40)

## 2016-10-08 ENCOUNTER — Other Ambulatory Visit: Payer: Self-pay | Admitting: Physician Assistant

## 2016-10-08 DIAGNOSIS — E782 Mixed hyperlipidemia: Secondary | ICD-10-CM

## 2016-10-08 MED ORDER — PRAVASTATIN SODIUM 40 MG PO TABS: 40.0000 mg | ORAL_TABLET | Freq: Every day | ORAL | 1 refills | Status: DC

## 2016-10-09 ENCOUNTER — Telehealth: Payer: Self-pay | Admitting: Physician Assistant

## 2016-10-09 NOTE — Telephone Encounter (Signed)
Pt would like a CB concerning his lab results from his 10/04/16. Please advise at [phone]

## 2016-10-10 NOTE — Telephone Encounter (Addendum)
PATIENT IS RETURNING OUR CALL FROM Tuesday REGARDING HIS LAB RESULTS FROM [facility]PHAN[facility] ENGLISH. (ORIGINALLY ROUTED TO DR. Katrinka BlazingSMITH IN ERROR). BEST PHONE [phone] (CELL) PHARMACY CHOICE IF NEEDED IS [facility] ON [address]. MBC

## 2016-10-11 NOTE — Telephone Encounter (Signed)
IC pt.  He states he came by office on Wed 7/18 and someone explained lab results, etc. He was appreciative.

## 2017-06-24 ENCOUNTER — Encounter: Payer: Self-pay | Admitting: Physician Assistant

## 2017-07-26 ENCOUNTER — Encounter: Payer: Self-pay | Admitting: Emergency Medicine

## 2017-07-26 ENCOUNTER — Ambulatory Visit: Payer: BC Managed Care – PPO | Admitting: Emergency Medicine

## 2017-07-26 ENCOUNTER — Other Ambulatory Visit: Payer: Self-pay

## 2017-07-26 VITALS — BP 132/82 | HR 108 | Temp 97.9°F | Resp 16 | Ht 66.5 in | Wt 207.6 lb

## 2017-07-26 DIAGNOSIS — Z8659 Personal history of other mental and behavioral disorders: Secondary | ICD-10-CM

## 2017-07-26 DIAGNOSIS — Z125 Encounter for screening for malignant neoplasm of prostate: Secondary | ICD-10-CM

## 2017-07-26 DIAGNOSIS — Z8639 Personal history of other endocrine, nutritional and metabolic disease: Secondary | ICD-10-CM | POA: Diagnosis not present

## 2017-07-26 DIAGNOSIS — R4582 Worries: Secondary | ICD-10-CM | POA: Diagnosis not present

## 2017-07-26 NOTE — Progress Notes (Signed)
Mark Estrada 62 y.o.   Chief Complaint  Patient presents with  . Benign Prostatic Hypertrophy    per patient wants prostate checked - saw commerical on TV    HISTORY OF PRESENT ILLNESS: This is a 62 y.o. male with history of OCD.  Concerned about his prostate.  Asymptomatic.  Denies any urinary symptoms.  Gets up to urinate 1 time per night.  HPI   Prior to Admission medications   Medication Sig Start Date End Date Taking? Authorizing Provider  PARoxetine (PAXIL) 40 MG tablet Take 80 mg by mouth every morning. Reported on 04/26/2015   Yes [provider]  pravastatin (PRAVACHOL) 40 MG tablet Take 1 tablet (40 mg total) by mouth daily. 10/08/16  Yes English, Judeth Cornfield D, PA  cetirizine (ZYRTEC) 10 MG tablet Take 1 tablet (10 mg total) by mouth daily. Patient not taking: Reported on 07/26/2017 10/02/16   Wallis Bamberg, PA-C  lisinopril-hydrochlorothiazide (PRINZIDE,ZESTORETIC) 10-12.5 MG tablet Take 1 tablet by mouth daily. Patient not taking: Reported on 07/26/2017 10/02/16   Wallis Bamberg, PA-C    Allergies  Allergen Reactions  . Erythromycin     Itching and hives     Patient Active Problem List   Diagnosis Date Noted  . Anxiety and depression 09/27/2013    Past Medical History:  Diagnosis Date  . Anxiety   . Depression   . OCD (obsessive compulsive disorder) 1998    Past Surgical History:  Procedure Laterality Date  . APPENDECTOMY    . TONSILLECTOMY AND ADENOIDECTOMY      Social History   Socioeconomic History  . Marital status: Married    Spouse name: [name]  . Number of children: 1  . Years of education: Master's  . Highest education level: Not on file  Occupational History  . Occupation: History Teacher    Comment: Psychiatrist  Social Needs  . Financial resource strain: Not on file  . Food insecurity:    Worry: Not on file    Inability: Not on file  . Transportation needs:    Medical: Not on file    Non-medical: Not on file    Tobacco Use  . Smoking status: Never Smoker  . Smokeless tobacco: Never Used  Substance and Sexual Activity  . Alcohol use: No  . Drug use: No  . Sexual activity: Not on file  Lifestyle  . Physical activity:    Days per week: Not on file    Minutes per session: Not on file  . Stress: Not on file  Relationships  . Social connections:    Talks on phone: Not on file    Gets together: Not on file    Attends religious service: Not on file    Active member of club or organization: Not on file    Attends meetings of clubs or organizations: Not on file    Relationship status: Not on file  . Intimate partner violence:    Fear of current or ex partner: Not on file    Emotionally abused: Not on file    Physically abused: Not on file    Forced sexual activity: Not on file  Other Topics Concern  . Not on file  Social History Narrative   Lives with his wife and their daughter.    Family History  Problem Relation Age of Onset  . Heart disease Father   . Hyperlipidemia Brother      Review of Systems  Constitutional: Negative.  Negative for chills, fever  and weight loss.  HENT: Negative.  Negative for congestion, nosebleeds and sore throat.   Eyes: Negative.  Negative for discharge and redness.  Respiratory: Negative.  Negative for cough, shortness of breath and wheezing.   Cardiovascular: Negative.  Negative for chest pain, palpitations and leg swelling.  Gastrointestinal: Negative.  Negative for abdominal pain, blood in stool, diarrhea, melena, nausea and vomiting.  Genitourinary: Negative.  Negative for dysuria, flank pain, hematuria and urgency.  Musculoskeletal: Negative for back pain, myalgias and neck pain.  Skin: Negative.  Negative for rash.  Neurological: Negative.  Negative for dizziness, sensory change, focal weakness, weakness and headaches.  Endo/Heme/Allergies: Negative.   All other systems reviewed and are negative.  Vitals:   07/26/17 1402 07/26/17 1439  BP:  (!) 152/118 132/82  Pulse: (!) 108   Resp: 16   Temp: 97.9 F (36.6 C)   SpO2: 97%      Physical Exam  Constitutional: He is oriented to person, place, and time. He appears well-developed and well-nourished.  HENT:  Head: Normocephalic and atraumatic.  Eyes: Pupils are equal, round, and reactive to light. Conjunctivae and EOM are normal.  Neck: Normal range of motion. Neck supple.  Cardiovascular: Normal rate and regular rhythm.  Pulmonary/Chest: Effort normal.  Abdominal: Soft. There is no tenderness.  Genitourinary: Rectum normal and prostate normal. Prostate is not enlarged and not tender.  Musculoskeletal: Normal range of motion.  Neurological: He is alert and oriented to person, place, and time.  Skin: Skin is warm and dry. Capillary refill takes less than 2 seconds.  Psychiatric: He has a normal mood and affect. His behavior is normal.  Vitals reviewed.    ASSESSMENT & PLAN: Crist was seen today for benign prostatic hypertrophy.  Diagnoses and all orders for this visit:  Worries -     PSA -     CBC with Differential/Platelet -     Comprehensive metabolic panel  History of OCD (obsessive compulsive disorder)  History of hypercalcemia  Prostate cancer screening    Patient Instructions       IF you received an x-ray today, you will receive an invoice from [facility]. Please contact [facility] at [phone] with questions or concerns regarding your invoice.   IF you received labwork today, you will receive an invoice from [facility]. Please contact [facility] at [phone] with questions or concerns regarding your invoice.   Our billing staff will not be able to assist you with questions regarding bills from these companies.  You will be contacted with the lab results as soon as they are available. The fastest way to get your results is to activate your My Chart account. Instructions are located on the last page of this paperwork. If  you have not heard from [place] regarding the results in 2 weeks, please contact this office.     Health Maintenance, Male A healthy lifestyle and preventive care is important for your health and wellness. Ask your health care provider about what schedule of regular examinations is right for you. What should I know about weight and diet? Eat a Healthy Diet  Eat plenty of vegetables, fruits, whole grains, low-fat dairy products, and lean protein.  Do not eat a lot of foods high in solid fats, added sugars, or salt.  Maintain a Healthy Weight Regular exercise can help you achieve or maintain a healthy weight. You should:  Do at least 150 minutes of exercise each week. The exercise should increase your heart rate and make  you sweat (moderate-intensity exercise).  Do strength-training exercises at least twice a week.  Watch Your Levels of Cholesterol and Blood Lipids  Have your blood tested for lipids and cholesterol every 5 years starting at 61 years of age. If you are at high risk for heart disease, you should start having your blood tested when you are 61 years old. You may need to have your cholesterol levels checked more often if: ? Your lipid or cholesterol levels are high. ? You are older than 61 years of age. ? You are at high risk for heart disease.  What should I know about cancer screening? Many types of cancers can be detected early and may often be prevented. Lung Cancer  You should be screened every year for lung cancer if: ? You are a current smoker who has smoked for at least 30 years. ? You are a former smoker who has quit within the past 15 years.  Talk to your health care provider about your screening options, when you should start screening, and how often you should be screened.  Colorectal Cancer  Routine colorectal cancer screening usually begins at 61 years of age and should be repeated every 5-10 years until you are 61 years old. You may need to be screened more  often if early forms of precancerous polyps or small growths are found. Your health care provider may recommend screening at an earlier age if you have risk factors for colon cancer.  Your health care provider may recommend using home test kits to check for hidden blood in the stool.  A small camera at the end of a tube can be used to examine your colon (sigmoidoscopy or colonoscopy). This checks for the earliest forms of colorectal cancer.  Prostate and Testicular Cancer  Depending on your age and overall health, your health care provider may do certain tests to screen for prostate and testicular cancer.  Talk to your health care provider about any symptoms or concerns you have about testicular or prostate cancer.  Skin Cancer  Check your skin from head to toe regularly.  Tell your health care provider about any new moles or changes in moles, especially if: ? There is a change in a mole's size, shape, or color. ? You have a mole that is larger than a pencil eraser.  Always use sunscreen. Apply sunscreen liberally and repeat throughout the day.  Protect yourself by wearing long sleeves, pants, a wide-brimmed hat, and sunglasses when outside.  What should I know about heart disease, diabetes, and high blood pressure?  If you are 4-39 years of age, have your blood pressure checked every 3-5 years. If you are 68 years of age or older, have your blood pressure checked every year. You should have your blood pressure measured twice-once when you are at a hospital or clinic, and once when you are not at a hospital or clinic. Record the average of the two measurements. To check your blood pressure when you are not at a hospital or clinic, you can use: ? An automated blood pressure machine at a pharmacy. ? A home blood pressure monitor.  Talk to your health care provider about your target blood pressure.  If you are between 50-54 years old, ask your health care provider if you should take  aspirin to prevent heart disease.  Have regular diabetes screenings by checking your fasting blood sugar level. ? If you are at a normal weight and have a low risk for diabetes, have  this test once every three years after the age of 59. ? If you are overweight and have a high risk for diabetes, consider being tested at a younger age or more often.  A one-time screening for abdominal aortic aneurysm (AAA) by ultrasound is recommended for men aged 65-75 years who are current or former smokers. What should I know about preventing infection? Hepatitis B If you have a higher risk for hepatitis B, you should be screened for this virus. Talk with your health care provider to find out if you are at risk for hepatitis B infection. Hepatitis C Blood testing is recommended for:  Everyone born from [birth year] through 1965.  Anyone with known risk factors for hepatitis C.  Sexually Transmitted Diseases (STDs)  You should be screened each year for STDs including gonorrhea and chlamydia if: ? You are sexually active and are younger than 61 years of age. ? You are older than 61 years of age and your health care provider tells you that you are at risk for this type of infection. ? Your sexual activity has changed since you were last screened and you are at an increased risk for chlamydia or gonorrhea. Ask your health care provider if you are at risk.  Talk with your health care provider about whether you are at high risk of being infected with HIV. Your health care provider may recommend a prescription medicine to help prevent HIV infection.  What else can I do?  Schedule regular health, dental, and eye exams.  Stay current with your vaccines (immunizations).  Do not use any tobacco products, such as cigarettes, chewing tobacco, and e-cigarettes. If you need help quitting, ask your health care provider.  Limit alcohol intake to no more than 2 drinks per day. One drink equals 12 ounces of beer, 5 ounces of  wine, or 1 ounces of hard liquor.  Do not use street drugs.  Do not share needles.  Ask your health care provider for help if you need support or information about quitting drugs.  Tell your health care provider if you often feel depressed.  Tell your health care provider if you have ever been abused or do not feel safe at home. This information is not intended to replace advice given to you by your health care provider. Make sure you discuss any questions you have with your health care provider. Document Released: 09/08/2007 Document Revised: 11/09/2015 Document Reviewed: 12/14/2014 Elsevier Interactive Patient Education  2018 Elsevier Inc.      Edwina Barth, MD [facility]

## 2017-07-26 NOTE — Patient Instructions (Addendum)
   IF you received an x-ray today, you will receive an invoice from [facility]. Please contact [facility] at [phone] with questions or concerns regarding your invoice.   IF you received labwork today, you will receive an invoice from [facility]. Please contact [facility] at [phone] with questions or concerns regarding your invoice.   Our billing staff will not be able to assist you with questions regarding bills from these companies.  You will be contacted with the lab results as soon as they are available. The fastest way to get your results is to activate your My Chart account. Instructions are located on the last page of this paperwork. If you have not heard from us regarding the results in 2 weeks, please contact this office.      Health Maintenance, Male A healthy lifestyle and preventive care is important for your health and wellness. Ask your health care provider about what schedule of regular examinations is right for you. What should I know about weight and diet? Eat a Healthy Diet  Eat plenty of vegetables, fruits, whole grains, low-fat dairy products, and lean protein.  Do not eat a lot of foods high in solid fats, added sugars, or salt.  Maintain a Healthy Weight Regular exercise can help you achieve or maintain a healthy weight. You should:  Do at least 150 minutes of exercise each week. The exercise should increase your heart rate and make you sweat (moderate-intensity exercise).  Do strength-training exercises at least twice a week.  Watch Your Levels of Cholesterol and Blood Lipids  Have your blood tested for lipids and cholesterol every 5 years starting at 61 years of age. If you are at high risk for heart disease, you should start having your blood tested when you are 61 years old. You may need to have your cholesterol levels checked more often if: ? Your lipid or cholesterol levels are high. ? You are older than 61 years of age. ? You  are at high risk for heart disease.  What should I know about cancer screening? Many types of cancers can be detected early and may often be prevented. Lung Cancer  You should be screened every year for lung cancer if: ? You are a current smoker who has smoked for at least 30 years. ? You are a former smoker who has quit within the past 15 years.  Talk to your health care provider about your screening options, when you should start screening, and how often you should be screened.  Colorectal Cancer  Routine colorectal cancer screening usually begins at 61 years of age and should be repeated every 5-10 years until you are 61 years old. You may need to be screened more often if early forms of precancerous polyps or small growths are found. Your health care provider may recommend screening at an earlier age if you have risk factors for colon cancer.  Your health care provider may recommend using home test kits to check for hidden blood in the stool.  A small camera at the end of a tube can be used to examine your colon (sigmoidoscopy or colonoscopy). This checks for the earliest forms of colorectal cancer.  Prostate and Testicular Cancer  Depending on your age and overall health, your health care provider may do certain tests to screen for prostate and testicular cancer.  Talk to your health care provider about any symptoms or concerns you have about testicular or prostate cancer.  Skin Cancer  Check your skin   from head to toe regularly.  Tell your health care provider about any new moles or changes in moles, especially if: ? There is a change in a mole's size, shape, or color. ? You have a mole that is larger than a pencil eraser.  Always use sunscreen. Apply sunscreen liberally and repeat throughout the day.  Protect yourself by wearing long sleeves, pants, a wide-brimmed hat, and sunglasses when outside.  What should I know about heart disease, diabetes, and high blood  pressure?  If you are 18-39 years of age, have your blood pressure checked every 3-5 years. If you are 40 years of age or older, have your blood pressure checked every year. You should have your blood pressure measured twice-once when you are at a hospital or clinic, and once when you are not at a hospital or clinic. Record the average of the two measurements. To check your blood pressure when you are not at a hospital or clinic, you can use: ? An automated blood pressure machine at a pharmacy. ? A home blood pressure monitor.  Talk to your health care provider about your target blood pressure.  If you are between 45-79 years old, ask your health care provider if you should take aspirin to prevent heart disease.  Have regular diabetes screenings by checking your fasting blood sugar level. ? If you are at a normal weight and have a low risk for diabetes, have this test once every three years after the age of 45. ? If you are overweight and have a high risk for diabetes, consider being tested at a younger age or more often.  A one-time screening for abdominal aortic aneurysm (AAA) by ultrasound is recommended for men aged 65-75 years who are current or former smokers. What should I know about preventing infection? Hepatitis B If you have a higher risk for hepatitis B, you should be screened for this virus. Talk with your health care provider to find out if you are at risk for hepatitis B infection. Hepatitis C Blood testing is recommended for:  Everyone born from 1945 through 1965.  Anyone with known risk factors for hepatitis C.  Sexually Transmitted Diseases (STDs)  You should be screened each year for STDs including gonorrhea and chlamydia if: ? You are sexually active and are younger than 61 years of age. ? You are older than 61 years of age and your health care provider tells you that you are at risk for this type of infection. ? Your sexual activity has changed since you were last  screened and you are at an increased risk for chlamydia or gonorrhea. Ask your health care provider if you are at risk.  Talk with your health care provider about whether you are at high risk of being infected with HIV. Your health care provider may recommend a prescription medicine to help prevent HIV infection.  What else can I do?  Schedule regular health, dental, and eye exams.  Stay current with your vaccines (immunizations).  Do not use any tobacco products, such as cigarettes, chewing tobacco, and e-cigarettes. If you need help quitting, ask your health care provider.  Limit alcohol intake to no more than 2 drinks per day. One drink equals 12 ounces of beer, 5 ounces of wine, or 1 ounces of hard liquor.  Do not use street drugs.  Do not share needles.  Ask your health care provider for help if you need support or information about quitting drugs.  Tell your health care   provider if you often feel depressed.  Tell your health care provider if you have ever been abused or do not feel safe at home. This information is not intended to replace advice given to you by your health care provider. Make sure you discuss any questions you have with your health care provider. Document Released: 09/08/2007 Document Revised: 11/09/2015 Document Reviewed: 12/14/2014 Elsevier Interactive Patient Education  2018 Elsevier Inc.  

## 2017-07-27 LAB — COMPREHENSIVE METABOLIC PANEL
ALK PHOS: 78 IU/L (ref 39–117)
ALT: 31 IU/L (ref 0–44)
AST: 31 IU/L (ref 0–40)
Albumin/Globulin Ratio: 1.5 (ref 1.2–2.2)
Albumin: 5 g/dL — ABNORMAL HIGH (ref 3.6–4.8)
BUN / CREAT RATIO: 11 (ref 10–24)
BUN: 11 mg/dL (ref 8–27)
Bilirubin Total: 0.8 mg/dL (ref 0.0–1.2)
CALCIUM: 10.4 mg/dL — AB (ref 8.6–10.2)
CO2: 22 mmol/L (ref 20–29)
CREATININE: 1.02 mg/dL (ref 0.76–1.27)
Chloride: 100 mmol/L (ref 96–106)
GFR, EST AFRICAN AMERICAN: 92 mL/min/{1.73_m2} (ref >59)
GFR, EST NON AFRICAN AMERICAN: 80 mL/min/{1.73_m2} (ref >59)
GLOBULIN, TOTAL: 3.4 g/dL (ref 1.5–4.5)
GLUCOSE: 109 mg/dL — AB (ref 65–99)
Potassium: 4.6 mmol/L (ref 3.5–5.2)
SODIUM: 142 mmol/L (ref 134–144)
TOTAL PROTEIN: 8.4 g/dL (ref 6.0–8.5)

## 2017-07-27 LAB — CBC WITH DIFFERENTIAL/PLATELET
BASOS ABS: 0 10*3/uL (ref 0.0–0.2)
Basos: 0 %
EOS (ABSOLUTE): 0.4 10*3/uL (ref 0.0–0.4)
EOS: 3 %
HEMATOCRIT: 51.8 % — AB (ref 37.5–51.0)
Hemoglobin: 18 g/dL — ABNORMAL HIGH (ref 13.0–17.7)
IMMATURE GRANS (ABS): 0 10*3/uL (ref 0.0–0.1)
IMMATURE GRANULOCYTES: 0 %
LYMPHS ABS: 3.1 10*3/uL (ref 0.7–3.1)
LYMPHS: 30 %
MCH: 32.1 pg (ref 26.6–33.0)
MCHC: 34.7 g/dL (ref 31.5–35.7)
MCV: 92 fL (ref 79–97)
MONOCYTES: 7 %
Monocytes Absolute: 0.7 10*3/uL (ref 0.1–0.9)
Neutrophils Absolute: 6.2 10*3/uL (ref 1.4–7.0)
Neutrophils: 60 %
Platelets: 296 10*3/uL (ref 150–379)
RBC: 5.61 x10E6/uL (ref 4.14–5.80)
RDW: 13.7 % (ref 12.3–15.4)
WBC: 10.4 10*3/uL (ref 3.4–10.8)

## 2017-07-27 LAB — PSA: PROSTATE SPECIFIC AG, SERUM: 0.2 ng/mL (ref 0.0–4.0)

## 2017-07-29 ENCOUNTER — Telehealth: Payer: Self-pay | Admitting: Emergency Medicine

## 2017-07-29 NOTE — Telephone Encounter (Signed)
Agree with Chelle's assessment.  Thanks.

## 2017-07-29 NOTE — Telephone Encounter (Signed)
Patient presents in clinic to ask if his labs are resulted. Patient states he was told by provider they would be back on Monday. Advised patient to allow provider two weeks to review lab results. He states he has a history of OCD, couldn't sleep all weekend, and is anxious to have results of his tests. Patient advised he will be contacted with his results as soon as they are available.   Provider, please review and release results.

## 2017-07-29 NOTE — Telephone Encounter (Signed)
1. Generally, the results are normal. SPECIFICALLY, the Prostate test is NORMAL. 2. The glucose is mildly elevated. If he was not fasting, that is normal. If he was fasting, I recommend a recheck, also fasting, along with A1C. 3. The hemoglobin is mildly elevated. This could be transient, and should be rechecked in the next month. If it remains elevated, we would do additional testing to determine the cause. 4. The other MILD abnormalities are within acceptable range, and not clinically significant.

## 2017-07-29 NOTE — Telephone Encounter (Signed)
Pt arrived to office very concerned about 05/03 lab results.  Not reviewed yet by ordering provider but Porfirio Oar PA-C willing to review to help alleviate pt concern.  Provided results/recommendations to pt as documented by Audrie Gallus.

## 2017-07-30 ENCOUNTER — Encounter: Payer: Self-pay | Admitting: *Deleted

## 2017-08-30 DIAGNOSIS — I10 Essential (primary) hypertension: Secondary | ICD-10-CM | POA: Insufficient documentation

## 2017-10-09 ENCOUNTER — Encounter: Payer: Self-pay | Admitting: Emergency Medicine

## 2017-10-09 ENCOUNTER — Ambulatory Visit: Payer: BC Managed Care – PPO | Admitting: Emergency Medicine

## 2017-10-09 ENCOUNTER — Ambulatory Visit (INDEPENDENT_AMBULATORY_CARE_PROVIDER_SITE_OTHER): Payer: BC Managed Care – PPO

## 2017-10-09 ENCOUNTER — Other Ambulatory Visit: Payer: Self-pay

## 2017-10-09 VITALS — BP 136/86 | HR 103 | Temp 98.0°F | Resp 16 | Ht 65.25 in | Wt 219.6 lb

## 2017-10-09 DIAGNOSIS — M25551 Pain in right hip: Secondary | ICD-10-CM | POA: Diagnosis not present

## 2017-10-09 MED ORDER — DICLOFENAC SODIUM 75 MG PO TBEC: 75.0000 mg | DELAYED_RELEASE_TABLET | Freq: Two times a day (BID) | ORAL | 0 refills | Status: AC

## 2017-10-09 NOTE — Patient Instructions (Addendum)
     IF you received an x-ray today, you will receive an invoice from [facility]. Please contact [facility] at [phone] with questions or concerns regarding your invoice.   IF you received labwork today, you will receive an invoice from [facility]. Please contact [facility] at [phone] with questions or concerns regarding your invoice.   Our billing staff will not be able to assist you with questions regarding bills from these companies.  You will be contacted with the lab results as soon as they are available. The fastest way to get your results is to activate your My Chart account. Instructions are located on the last page of this paperwork. If you have not heard from us regarding the results in 2 weeks, please contact this office.     Hip Pain The hip is the joint between the upper legs and the lower pelvis. The bones, cartilage, tendons, and muscles of your hip joint support your body and allow you to move around. Hip pain can range from a minor ache to severe pain in one or both of your hips. The pain may be felt on the inside of the hip joint near the groin, or the outside near the buttocks and upper thigh. You may also have swelling or stiffness. Follow these instructions at home: Managing pain, stiffness, and swelling  If directed, apply ice to the injured area. ? Put ice in a plastic bag. ? Place a towel between your skin and the bag. ? Leave the ice on for 20 minutes, 2-3 times a day  Sleep with a pillow between your legs on your most comfortable side.  Avoid any activities that cause pain. General instructions  Take over-the-counter and prescription medicines only as told by your health care provider.  Do any exercises as told by your health care provider.  Record the following: ? How often you have hip pain. ? The location of your pain. ? What the pain feels like. ? What makes the pain worse.  Keep all follow-up visits as told by your health  care provider. This is important. Contact a health care provider if:  You cannot put weight on your leg.  Your pain or swelling continues or gets worse after one week.  It gets harder to walk.  You have a fever. Get help right away if:  You fall.  You have a sudden increase in pain and swelling in your hip.  Your hip is red or swollen or very tender to touch. Summary  Hip pain can range from a minor ache to severe pain in one or both of your hips.  The pain may be felt on the inside of the hip joint near the groin, or the outside near the buttocks and upper thigh.  Avoid any activities that cause pain.  Record how often you have hip pain, the location of the pain, what makes it worse and what it feels like. This information is not intended to replace advice given to you by your health care provider. Make sure you discuss any questions you have with your health care provider. Document Released: 08/30/2009 Document Revised: 02/13/2016 Document Reviewed: 02/13/2016 Elsevier Interactive Patient Education  2018 Elsevier Inc.  

## 2017-10-09 NOTE — Progress Notes (Signed)
Mark GirtLawrence Estrada 62 y.o.   Chief Complaint  Patient presents with  . Hip Pain    x 1 month per patient into the lower back    HISTORY OF PRESENT ILLNESS: This is a 62 y.o. male complaining of right-sided hip pain for the past several months.  Denies direct injury.  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  Once he changed jobs pain got better but now pain came back the past several weeks.  Pain is sharp and constant with no radiation not associated with any other significant symptomatology.  Worse with movement and better with rest.  HPI   Prior to Admission medications   Medication Sig Start Date End Date Taking? Authorizing Provider  lisinopril-hydrochlorothiazide (PRINZIDE,ZESTORETIC) 10-12.5 MG tablet Take 1 tablet by mouth daily. 10/02/16  Yes Wallis BambergMani, Mario, PA-C  PARoxetine (PAXIL) 40 MG tablet Take 80 mg by mouth every morning. Reported on 04/26/2015   Yes [provider]  cetirizine (ZYRTEC) 10 MG tablet Take 1 tablet (10 mg total) by mouth daily. Patient not taking: Reported on 07/26/2017 10/02/16   Wallis BambergMani, Mario, PA-C  pravastatin (PRAVACHOL) 40 MG tablet Take 1 tablet (40 mg total) by mouth daily. Patient not taking: Reported on 10/09/2017 10/08/16   Trena PlattEnglish, Stephanie D, PA    Allergies  Allergen Reactions  . Erythromycin     Itching and hives     Patient Active Problem List   Diagnosis Date Noted  . Worries 07/26/2017  . Anxiety and depression 09/27/2013    Past Medical History:  Diagnosis Date  . Anxiety   . Depression   . OCD (obsessive compulsive disorder) 1998    Past Surgical History:  Procedure Laterality Date  . APPENDECTOMY    . TONSILLECTOMY AND ADENOIDECTOMY      Social History   Socioeconomic History  . Marital status: Married    Spouse name: [name]  . Number of children: 1  . Years of education: Master's  . Highest education level: Not on file    Occupational History  . Occupation: History Teacher    Comment: [employer]  Social Needs  . Financial resource strain: Not on file  . Food insecurity:    Worry: Not on file    Inability: Not on file  . Transportation needs:    Medical: Not on file    Non-medical: Not on file  Tobacco Use  . Smoking status: Never Smoker  . Smokeless tobacco: Never Used  Substance and Sexual Activity  . Alcohol use: No  . Drug use: No  . Sexual activity: Not on file  Lifestyle  . Physical activity:    Days per week: Not on file    Minutes per session: Not on file  . Stress: Not on file  Relationships  . Social connections:    Talks on phone: Not on file    Gets together: Not on file    Attends religious service: Not on file    Active member of club or organization: Not on file    Attends meetings of clubs or organizations: Not on file    Relationship status: Not on file  . Intimate partner violence:    Fear of current or ex partner: Not on file    Emotionally abused: Not on file    Physically abused: Not on file    Forced sexual activity: Not on file  Other Topics  Concern  . Not on file  Social History Narrative   Lives with his wife and their daughter.    Family History  Problem Relation Age of Onset  . Heart disease Father   . Hyperlipidemia Brother      Review of Systems  Constitutional: Negative.  Negative for chills and fever.  HENT: Negative.  Negative for sore throat.   Respiratory: Negative.  Negative for cough and shortness of breath.   Cardiovascular: Negative.  Negative for chest pain and palpitations.  Gastrointestinal: Negative.  Negative for abdominal pain, nausea and vomiting.  Genitourinary: Negative.  Negative for dysuria and hematuria.  Musculoskeletal: Positive for joint pain (Right hip pain).  Skin: Negative.  Negative for rash.  Neurological: Negative.  Negative for dizziness, sensory change, focal weakness, weakness and headaches.   Endo/Heme/Allergies: Negative.   All other systems reviewed and are negative.  Vitals:   10/09/17 1041  BP: 136/86  Pulse: (!) 103  Resp: 16  Temp: 98 F (36.7 C)  SpO2: 94%     Physical Exam  Constitutional: He is oriented to person, place, and time. He appears well-developed and well-nourished.  HENT:  Head: Normocephalic and atraumatic.  Nose: Nose normal.  Mouth/Throat: Oropharynx is clear and moist.  Eyes: Pupils are equal, round, and reactive to light. Conjunctivae and EOM are normal.  Neck: Normal range of motion. Neck supple. No thyromegaly present.  Cardiovascular: Normal rate, regular rhythm and normal heart sounds.  Pulmonary/Chest: Effort normal and breath sounds normal.  Abdominal: Soft. Bowel sounds are normal.  Musculoskeletal:  Left hip: No tenderness but complains of some pain during range of motion. Lumbar spine.  No significant tenderness. Abdomen: Benign Lower extremities: NVI.  Full range of motion.  Good peripheral pulses good DTRs.  Lymphadenopathy:    He has no cervical adenopathy.  Neurological: He is alert and oriented to person, place, and time. He displays normal reflexes. No sensory deficit. He exhibits normal muscle tone.  Skin: Skin is warm and dry. Capillary refill takes less than 2 seconds.  Psychiatric: He has a normal mood and affect. His behavior is normal.  Vitals reviewed.   Dg Hip Unilat W Or W/o Pelvis 2-3 Views Right  Result Date: 10/09/2017 CLINICAL DATA:  Right hip pain.  No reported injury. EXAM: DG HIP (WITH OR WITHOUT PELVIS) 2-3V RIGHT COMPARISON:  None. FINDINGS: No pelvic fracture or diastasis. No right hip fracture or dislocation. No right hip arthropathy. No suspicious focal osseous lesions. Degenerative changes in the visualized lower lumbar spine. No radiopaque foreign body. IMPRESSION: No fracture. No right hip arthropathy or malalignment. Degenerative changes in the visualized lower lumbar spine. Electronically Signed    By: Delbert Phenix M.D.   On: 10/09/2017 12:00   A total of 25 minutes was spent in the room with the patient, greater than 50% of which was in counseling/coordination of care regarding differential diagnosis.  Physical activity and limitations.  Management, medications and need for follow-up if no better or worse.  X-ray reviewed with patient in the room.  Agree with the radiologist's interpretation..  ASSESSMENT & PLAN: Mark Estrada was seen today for hip pain.  Diagnoses and all orders for this visit:  Right hip pain Comments: Tendinitis versus bursitis Orders: -     DG HIP UNILAT W OR W/O PELVIS 2-3 VIEWS RIGHT; Future -     diclofenac (VOLTAREN) 75 MG EC tablet; Take 1 tablet (75 mg total) by mouth 2 (two) times daily for 5 days.  After 5 days take as needed.    Patient Instructions       IF you received an x-ray today, you will receive an invoice from [facility]. Please contact [facility] at [phone] with questions or concerns regarding your invoice.   IF you received labwork today, you will receive an invoice from [facility]. Please contact [facility] at [phone] with questions or concerns regarding your invoice.   Our billing staff will not be able to assist you with questions regarding bills from these companies.  You will be contacted with the lab results as soon as they are available. The fastest way to get your results is to activate your My Chart account. Instructions are located on the last page of this paperwork. If you have not heard from [place] regarding the results in 2 weeks, please contact this office.     Hip Pain The hip is the joint between the upper legs and the lower pelvis. The bones, cartilage, tendons, and muscles of your hip joint support your body and allow you to move around. Hip pain can range from a minor ache to severe pain in one or both of your hips. The pain may be felt on the inside of the hip joint near the groin, or the outside  near the buttocks and upper thigh. You may also have swelling or stiffness. Follow these instructions at home: Managing pain, stiffness, and swelling  If directed, apply ice to the injured area. ? Put ice in a plastic bag. ? Place a towel between your skin and the bag. ? Leave the ice on for 20 minutes, 2-3 times a day  Sleep with a pillow between your legs on your most comfortable side.  Avoid any activities that cause pain. General instructions  Take over-the-counter and prescription medicines only as told by your health care provider.  Do any exercises as told by your health care provider.  Record the following: ? How often you have hip pain. ? The location of your pain. ? What the pain feels like. ? What makes the pain worse.  Keep all follow-up visits as told by your health care provider. This is important. Contact a health care provider if:  You cannot put weight on your leg.  Your pain or swelling continues or gets worse after one week.  It gets harder to walk.  You have a fever. Get help right away if:  You fall.  You have a sudden increase in pain and swelling in your hip.  Your hip is red or swollen or very tender to touch. Summary  Hip pain can range from a minor ache to severe pain in one or both of your hips.  The pain may be felt on the inside of the hip joint near the groin, or the outside near the buttocks and upper thigh.  Avoid any activities that cause pain.  Record how often you have hip pain, the location of the pain, what makes it worse and what it feels like. This information is not intended to replace advice given to you by your health care provider. Make sure you discuss any questions you have with your health care provider. Document Released: 08/30/2009 Document Revised: 02/13/2016 Document Reviewed: 02/13/2016 Elsevier Interactive Patient Education  2018 Elsevier Inc.      Edwina Barth, MD [facility]

## 2017-12-27 ENCOUNTER — Encounter: Payer: Self-pay | Admitting: *Deleted

## 2017-12-31 ENCOUNTER — Other Ambulatory Visit: Payer: Self-pay

## 2017-12-31 ENCOUNTER — Telehealth: Payer: Self-pay | Admitting: Psychiatry

## 2017-12-31 MED ORDER — LORAZEPAM 1 MG PO TABS: 1.0000 mg | ORAL_TABLET | Freq: Two times a day (BID) | ORAL | 1 refills | Status: DC | PRN

## 2017-12-31 NOTE — Telephone Encounter (Signed)
Pt called need refill Lorazapam. [facility] [address]. Next appt 10/24.

## 2017-12-31 NOTE — Telephone Encounter (Signed)
Refill already sent into Pharmacy by Traci.

## 2018-01-16 ENCOUNTER — Encounter: Payer: Self-pay | Admitting: Psychiatry

## 2018-01-16 ENCOUNTER — Ambulatory Visit: Payer: BC Managed Care – PPO | Admitting: Psychiatry

## 2018-01-16 DIAGNOSIS — F331 Major depressive disorder, recurrent, moderate: Secondary | ICD-10-CM

## 2018-01-16 DIAGNOSIS — F422 Mixed obsessional thoughts and acts: Secondary | ICD-10-CM

## 2018-01-16 MED ORDER — CLOMIPRAMINE HCL 50 MG PO CAPS: 200.0000 mg | ORAL_CAPSULE | Freq: Every day | ORAL | 2 refills | Status: DC

## 2018-01-16 MED ORDER — LORAZEPAM 1 MG PO TABS: ORAL_TABLET | ORAL | 0 refills | Status: DC

## 2018-01-16 NOTE — Progress Notes (Signed)
[facility]  Patient ID: Mark Estrada,  MRN: [id number]  PCP: Ethelda Chick, MD  Date of Evaluation: 01/16/2018 Time spent:30 minutes   HISTORY/CURRENT STATUS: HPI  CC: FU severe depression with SI, severe OCD, SE meds.  Pt reports that mood is Anxious and describes anxiety as Moderate. Anxiety symptoms include: Obsessive Compulsive Symptoms:   Checking,,. Pt reports no sleep issues with meds. Pt reports that appetite is increased. Pt reports that energy is lethargic and improved. Concentration is down slightly. Suicidal thoughts:  denied by patient in last 2 visits. Some improvement with the clomipramine in mood and OCD but not controlled yet. And not as much benefit as he had at one time with paxil.  No sleep benefit from it.   Asks about change in Seroquel DT SE. 300 mg Seroquel less effective for sleep.  Benefit from lorazapam. For anxiety. Takes in am and in afternoon.  Other psych med trials include: Paroxetine fluvoxamine olanzapine quetiapine aripiprazole risperidone and for sleep trazodone 50 which failed and temazepam and zolpidem which caused amnesia  Individual Medical History/ Review of Systems: Changes? :Yes Constipation.  No balance px.  Allergies: Erythromycin  Current Medications:  Current Outpatient Medications:  .  clomiPRAMINE (ANAFRANIL) 50 MG capsule, Take 200 mg by mouth at bedtime., Disp: , Rfl:  .  lisinopril-hydrochlorothiazide (PRINZIDE,ZESTORETIC) 10-12.5 MG tablet, Take 1 tablet by mouth daily., Disp: 90 tablet, Rfl: 1 .  LORazepam (ATIVAN) 1 MG tablet, Take 1 tablet (1 mg total) by mouth 2 (two) times daily as needed for anxiety., Disp: 60 tablet, Rfl: 1 .  QUEtiapine (SEROQUEL) 300 MG tablet, Take 450 mg by mouth at bedtime., Disp: , Rfl:  .  cetirizine (ZYRTEC) 10 MG tablet, Take 1 tablet (10 mg total) by mouth daily. (Patient not taking: Reported on 07/26/2017), Disp: 30 tablet, Rfl: 11 .  clomiPHENE (CLOMID) 50 MG tablet, Take 50  mg by mouth daily., Disp: , Rfl:  .  pravastatin (PRAVACHOL) 40 MG tablet, Take 1 tablet (40 mg total) by mouth daily. (Patient not taking: Reported on 10/09/2017), Disp: 90 tablet, Rfl: 1 Medication Side Effects: Other: dry and constipation  Family Medical/ Social History: Changes? Yes struggling with retirement and need for structure.  MENTAL HEALTH EXAM:  There were no vitals taken for this visit.There is no height or weight on file to calculate BMI.  General Appearance: Casual  Eye Contact:  Fair  Speech:  Normal Rate  Volume:  Normal  Mood:  Anxious  Affect:  Restricted  Thought Process:  Coherent and Goal Directed  Orientation:  Full (Time, Place, and Person)  Thought Content: Logical obsessions moderately severe  Suicidal Thoughts:  No  Homicidal Thoughts:  No  Memory:  Recent  Judgement:  Good  Insight:  Fair  Psychomotor Activity:  Decreased  Concentration:  Concentration: Good  Recall:  Good  Fund of Knowledge: Good  Language: Good  Akathisia:  No  AIMS (if indicated): not done  Assets:  Desire for Improvement Financial Resources/Insurance Housing Physical Health Talents/Skills Transportation  ADL's:  Intact  Cognition: WNL  Prognosis:  Fair    DIAGNOSES:    ICD-10-CM   1. Mixed obsessional thoughts and acts F42.2   2. Major depressive disorder, recurrent episode, moderate (HCC) F33.1     RECOMMENDATIONS:  Greater than 50% of face to face time with patient was spent on counseling and coordination of care. We discussed his recent bout of severe major depression with suicidal ideation and relapse of  OCD.  This relapse occurred after his retirement which was clearly a contributing return effect.  We discussed ways to deal with this contributing risk factor.  Discussed CBT and exposure response prevention techniques for managing the OCD.  Try to wean quetiapine over a couple of weeks due to the side effects that he is having and take lorazapam in it's place  1-2mg .  DT risks of DDI with other sleepers with clomipramine we are limited in the sleep options we have.. Call if insomnia or depression recurs.  Discussed the risk of relapse of depression off of quetiapine.  Call promptly if that occurs. Follow-up 6 weeks   Lauraine Rinne, MD

## 2018-01-16 NOTE — Patient Instructions (Signed)
Wean off quetiapine by 1/2 tablet every few days until you stop it.  Call if insomnia or depression recurs.

## 2018-02-14 ENCOUNTER — Other Ambulatory Visit: Payer: Self-pay | Admitting: Psychiatry

## 2018-03-03 ENCOUNTER — Encounter: Payer: Self-pay | Admitting: Psychiatry

## 2018-03-03 ENCOUNTER — Ambulatory Visit: Payer: BC Managed Care – PPO | Admitting: Psychiatry

## 2018-03-03 ENCOUNTER — Other Ambulatory Visit: Payer: Self-pay | Admitting: Psychiatry

## 2018-03-03 DIAGNOSIS — F422 Mixed obsessional thoughts and acts: Secondary | ICD-10-CM

## 2018-03-03 DIAGNOSIS — F332 Major depressive disorder, recurrent severe without psychotic features: Secondary | ICD-10-CM

## 2018-03-03 MED ORDER — SERTRALINE HCL 100 MG PO TABS: ORAL_TABLET | ORAL | 1 refills | Status: DC

## 2018-03-03 MED ORDER — ARIPIPRAZOLE 5 MG PO TABS: 10.0000 mg | ORAL_TABLET | Freq: Every day | ORAL | 1 refills | Status: DC

## 2018-03-03 MED ORDER — LORAZEPAM 1 MG PO TABS: 1.0000 mg | ORAL_TABLET | Freq: Three times a day (TID) | ORAL | 1 refills | Status: DC

## 2018-03-03 MED ORDER — LORAZEPAM 1 MG PO TABS: ORAL_TABLET | ORAL | 1 refills | Status: DC

## 2018-03-03 NOTE — Progress Notes (Signed)
Lorazepam refill

## 2018-03-03 NOTE — Progress Notes (Signed)
Mark GirtLawrence Estrada [id number] 12/09/1955 62 y.o.  Subjective:   Patient ID:  Mark Estrada is a 62 y.o. (DOB 09/05/1955) male.  Chief Complaint:  Chief Complaint  Patient presents with  . Follow-up    Anxiety and OCD    HPI Mark GirtLawrence [name] presents to the office today for follow-up of unmanaged OCD and on clomipramine 200 with therapeutic level since 12/20/17.  "Not worth a doodle" re lack of effectiveness.  Like not taking anything Re: OCD and some anxiety and yet no depression.  Gone back to checking and repeating and worry re $, pension is not enough to live on.  Fleeting SI much better than before but not gone.  Lorazepam helped sleep somewhat on 1-2mg  HS.  Was able to stop quetiapine ok.  Asks about return to paxil DT OCD.   Other psych med trials include: Paroxetine fluvoxamine(both of which eventually failed even at high dosage) olanzapine quetiapine aripiprazole risperidone and for sleep trazodone 50 which failed and temazepam and zolpidem which caused amnesia  Lost be  Review of Systems:  Review of Systems  Neurological: Negative for tremors and weakness.  Psychiatric/Behavioral: Positive for suicidal ideas. Negative for agitation, behavioral problems, confusion, decreased concentration, dysphoric mood, hallucinations, self-injury and sleep disturbance. The patient is nervous/anxious. The patient is not hyperactive.     Medications: I have reviewed the patient's current medications.  Current Outpatient Medications  Medication Sig Dispense Refill  . clomiPRAMINE (ANAFRANIL) 50 MG capsule Take 4 capsules (200 mg total) by mouth at bedtime. 120 capsule 2  . lisinopril-hydrochlorothiazide (PRINZIDE,ZESTORETIC) 10-12.5 MG tablet Take 1 tablet by mouth daily. 90 tablet 1  . LORazepam (ATIVAN) 1 MG tablet TAKE 1 TABLET BY MOUTH TWICE DAILY AS NEEDED FOR ANXIETY AND 1 TO 2 TABLETS AT BEDTIME 30 tablet 0   No current facility-administered medications for this visit.      Medication Side Effects: Other: GI issues.  Miralax helped.  Allergies:  Allergies  Allergen Reactions  . Erythromycin     Itching and hives     Past Medical History:  Diagnosis Date  . Anxiety   . Depression   . OCD (obsessive compulsive disorder) 1998    Family History  Problem Relation Age of Onset  . Heart disease Father   . Hyperlipidemia Brother     Social History   Socioeconomic History  . Marital status: Married    Spouse name: [name]  . Number of children: 1  . Years of education: Master's  . Highest education level: Not on file  Occupational History  . Occupation: History Teacher    Comment: [employer]  Social Needs  . Financial resource strain: Not on file  . Food insecurity:    Worry: Not on file    Inability: Not on file  . Transportation needs:    Medical: Not on file    Non-medical: Not on file  Tobacco Use  . Smoking status: Never Smoker  . Smokeless tobacco: Never Used  Substance and Sexual Activity  . Alcohol use: No  . Drug use: No  . Sexual activity: Not on file  Lifestyle  . Physical activity:    Days per week: Not on file    Minutes per session: Not on file  . Stress: Not on file  Relationships  . Social connections:    Talks on phone: Not on file    Gets together: Not on file    Attends religious service: Not on file    Active  member of club or organization: Not on file    Attends meetings of clubs or organizations: Not on file    Relationship status: Not on file  . Intimate partner violence:    Fear of current or ex partner: Not on file    Emotionally abused: Not on file    Physically abused: Not on file    Forced sexual activity: Not on file  Other Topics Concern  . Not on file  Social History Narrative   Lives with his wife and their daughter.    Past Medical History, Surgical history, Social history, and Family history were reviewed and updated as appropriate.   Please see review of systems for  further details on the patient's review from today.   Objective:   Physical Exam:  There were no vitals taken for this visit.  Physical Exam  Constitutional: He is oriented to person, place, and time. He appears well-developed. No distress.  Musculoskeletal: He exhibits no deformity.  Neurological: He is alert and oriented to person, place, and time. He displays no tremor. Coordination and gait normal.  Psychiatric: His speech is normal and behavior is normal. Judgment and thought content normal. His mood appears anxious. His affect is not angry, not blunt, not labile and not inappropriate. Cognition and memory are normal. He does not exhibit a depressed mood. He expresses no homicidal and no suicidal ideation. He expresses no suicidal plans and no homicidal plans.  Insightand judgment fair. No auditory or visual hallucinations. No delusions.  OCD worse on clomipramine but so far depression is better. He is attentive.    Lab Review:     Component Value Date/Time   NA 142 07/26/2017 1534   K 4.6 07/26/2017 1534   CL 100 07/26/2017 1534   CO2 22 07/26/2017 1534   GLUCOSE 109 (H) 07/26/2017 1534   GLUCOSE 99 12/11/2014 1119   BUN 11 07/26/2017 1534   CREATININE 1.02 07/26/2017 1534   CREATININE 0.99 12/11/2014 1119   CALCIUM 10.4 (H) 07/26/2017 1534   PROT 8.4 07/26/2017 1534   ALBUMIN 5.0 (H) 07/26/2017 1534   AST 31 07/26/2017 1534   ALT 31 07/26/2017 1534   ALKPHOS 78 07/26/2017 1534   BILITOT 0.8 07/26/2017 1534   GFRNONAA 80 07/26/2017 1534   GFRNONAA 84 12/11/2014 1119   GFRAA 92 07/26/2017 1534   GFRAA >89 12/11/2014 1119       Component Value Date/Time   WBC 10.4 07/26/2017 1534   WBC 9.9 12/11/2014 1130   RBC 5.61 07/26/2017 1534   RBC 5.69 12/11/2014 1130   HGB 18.0 (H) 07/26/2017 1534   HCT 51.8 (H) 07/26/2017 1534   PLT 296 07/26/2017 1534   MCV 92 07/26/2017 1534   MCH 32.1 07/26/2017 1534   MCH 29.3 12/11/2014 1130   MCHC 34.7 07/26/2017 1534   MCHC  31.7 (A) 12/11/2014 1130   RDW 13.7 07/26/2017 1534   LYMPHSABS 3.1 07/26/2017 1534   EOSABS 0.4 07/26/2017 1534   BASOSABS 0.0 07/26/2017 1534    No results found for: POCLITH, LITHIUM   No results found for: PHENYTOIN, PHENOBARB, VALPROATE, CBMZ   .res Assessment: Plan:    Mixed obsessional thoughts and acts  Severe episode of recurrent major depressive disorder, without psychotic features (HCC)  Disc course progression of OCD and length of therapy required to achieve response.  Treatment of OCD typically requires along trials with serotonin medications.  He has achieved improvement with his depression but not the OCD. Clomipramine has  apparently helped the depression but not the ocd.   We will start sertraline as he has not taken that SSRI before and its a good one for both depression and OCD.  We will start sertraline 100 mg 1/2 daily for 4 days, then 1 daily for 5 days, then 1 1/2 tablets daily.  Discussed the side effects.  He asks for augmentation strategies for OCD.   Disc these which he's tried at least the ones that have faster response.  He did get benefit from Abilify 5 mg for a couple of years. Will retry it.  Discussed potential metabolic side effects associated with atypical antipsychotics, as well as potential risk for movement side effects. Advised pt to contact office if movement side effects occur.  No changes in clomipramine nor the lorazepam.  Continue those as you are now.  There is less drug to drug interaction between sertraline and clomipramine than most SSRIs and clomipramine.  I do not want him to lose the and if it from the clomipramine because his symptoms were so severe associated with the depression and suicidal ideation before the clomipramine.  Later it is hoped that the Zoloft will work well enough for both that we can wean the clomipramine.  We discussed the risk of serotonin syndrome with this combination.  Discussed those side effects.  Contact us with  any unusual side effects.  Follow-up 4 weeks  This was a 30-minute appointment.  Meredith Staggers MD, DFAPA  Please see After Visit Summary for patient specific instructions.  No future appointments.  No orders of the defined types were placed in this encounter.     -------------------------------

## 2018-03-03 NOTE — Patient Instructions (Addendum)
Sertraline (Zoloft) 1/2 daily for 4 days, then 1 daily for 5 days, then 1 1/2 tablets daily  Start aripiprazole 5mg  1 daily.  No changes in clomipramine nor the lorazepam.  Continue those as you are now.

## 2018-04-04 ENCOUNTER — Encounter: Payer: Self-pay | Admitting: Psychiatry

## 2018-04-04 ENCOUNTER — Ambulatory Visit: Payer: BC Managed Care – PPO | Admitting: Psychiatry

## 2018-04-04 DIAGNOSIS — F422 Mixed obsessional thoughts and acts: Secondary | ICD-10-CM | POA: Diagnosis not present

## 2018-04-04 DIAGNOSIS — F332 Major depressive disorder, recurrent severe without psychotic features: Secondary | ICD-10-CM | POA: Diagnosis not present

## 2018-04-04 MED ORDER — SERTRALINE HCL 100 MG PO TABS: 150.0000 mg | ORAL_TABLET | Freq: Every day | ORAL | 1 refills | Status: DC

## 2018-04-04 MED ORDER — LORAZEPAM 1 MG PO TABS: 1.0000 mg | ORAL_TABLET | Freq: Three times a day (TID) | ORAL | 1 refills | Status: DC

## 2018-04-04 MED ORDER — CLOMIPRAMINE HCL 50 MG PO CAPS: 150.0000 mg | ORAL_CAPSULE | Freq: Every day | ORAL | 1 refills | Status: DC

## 2018-04-04 MED ORDER — ARIPIPRAZOLE 5 MG PO TABS: 5.0000 mg | ORAL_TABLET | Freq: Every day | ORAL | 1 refills | Status: DC

## 2018-04-04 NOTE — Patient Instructions (Addendum)
Reduce clomipramine to 3 capsules daily  About February 10 if you are feeling well you can reduce clomipramine to 2 capsules daily

## 2018-04-04 NOTE — Progress Notes (Signed)
Mark Estrada [id number] Jul 23, 1955 63 y.o.  Subjective:   Patient ID:  Mark Estrada is a 63 y.o. (DOB 06/07/1955) male.  Chief Complaint:  Chief Complaint  Patient presents with  . Follow-up    Medication managament  . Depression  . Anxiety    HPI last seen 03/03/2018 Mark Estrada presents to the office today for follow-up of unmanaged OCD.   At the last visit he had been on clomipramine 200 with therapeutic level since 12/20/17.  "Not worth a doodle" re lack of effectiveness.  Like not taking anything Re: OCD and some anxiety and yet no depression.  Gone back to checking and repeating and worry re $, pension is not enough to live on.  Fleeting SI much better than before but not gone.  Because of these symptoms we initiated sertraline and Abilify.  If that worked well than the plan was to wean the clomipramine but not do that until sertraline worked because clomipramine had at least helped with the depression.  Today he reports being on Zoloft 150 for 3 weeks.  A lot of improvement.  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  Teaching history.  No problems with the depression.  That was mainly in the summer.  NO SE with Zoloft or Abilify.  Improvement was quick suggesting Abilify helped.  Anxiety is mild now.  Still worries about triggers.  Lorazepam helped sleep somewhat on 1-2mg  HS.  Was able to stop quetiapine ok.  Sleep ok.  Other psych med trials include: Paroxetine fluvoxamine(both of which eventually failed even at high dosage) olanzapine quetiapine aripiprazole risperidone and for sleep trazodone 50 which failed and temazepam and zolpidem which caused amnesia    Review of Systems:  Review of Systems  Neurological: Negative for tremors and weakness.  Psychiatric/Behavioral: Negative for agitation, behavioral problems, confusion, decreased concentration, dysphoric mood, hallucinations, self-injury,  sleep disturbance and suicidal ideas. The patient is nervous/anxious. The patient is not hyperactive.     Medications: I have reviewed the patient's current medications.  Current Outpatient Medications  Medication Sig Dispense Refill  . Acetylcysteine 600 MG CAPS Take 1,200 mg by mouth daily.    . ARIPiprazole (ABILIFY) 5 MG tablet Take 2 tablets (10 mg total) by mouth daily. (Patient taking differently: Take 5 mg by mouth daily. ) 30 tablet 1  . clomiPRAMINE (ANAFRANIL) 50 MG capsule Take 4 capsules (200 mg total) by mouth at bedtime. 120 capsule 2  . lisinopril-hydrochlorothiazide (PRINZIDE,ZESTORETIC) 10-12.5 MG tablet Take 1 tablet by mouth daily. 90 tablet 1  . LORazepam (ATIVAN) 1 MG tablet Take 1 tablet (1 mg total) by mouth every 8 (eight) hours. 90 tablet 1  . sertraline (ZOLOFT) 100 MG tablet 1/2 daily for 4 days, then 1 daily for 5 days, then 1 1/2 tablets daily 45 tablet 1   No current facility-administered medications for this visit.     Medication Side Effects: Other: GI issues.  Miralax helped.  Allergies:  Allergies  Allergen Reactions  . Erythromycin     Itching and hives     Past Medical History:  Diagnosis Date  . Anxiety   . Depression   . OCD (obsessive compulsive disorder) 1998    Family History  Problem Relation Age of Onset  . Heart disease Father   . Hyperlipidemia Brother     Social History   Socioeconomic History  . Marital status: Married    Spouse name: Mark Estrada  .  Number of children: 1  . Years of education: Master's  . Highest education level: Not on file  Occupational History  . Occupation: History Teacher    Comment: [employer]  Social Needs  . Financial resource strain: Not on file  . Food insecurity:    Worry: Not on file    Inability: Not on file  . Transportation needs:    Medical: Not on file    Non-medical: Not on file  Tobacco Use  . Smoking status: Never Smoker  . Smokeless tobacco: Never Used  Substance  and Sexual Activity  . Alcohol use: No  . Drug use: No  . Sexual activity: Not on file  Lifestyle  . Physical activity:    Days per week: Not on file    Minutes per session: Not on file  . Stress: Not on file  Relationships  . Social connections:    Talks on phone: Not on file    Gets together: Not on file    Attends religious service: Not on file    Active member of club or organization: Not on file    Attends meetings of clubs or organizations: Not on file    Relationship status: Not on file  . Intimate partner violence:    Fear of current or ex partner: Not on file    Emotionally abused: Not on file    Physically abused: Not on file    Forced sexual activity: Not on file  Other Topics Concern  . Not on file  Social History Narrative   Lives with his wife and their daughter.    Past Medical History, Surgical history, Social history, and Family history were reviewed and updated as appropriate.   Please see review of systems for further details on the patient's review from today.   Objective:   Physical Exam:  There were no vitals taken for this visit.  Physical Exam Constitutional:      General: He is not in acute distress.    Appearance: He is well-developed.  Musculoskeletal:        General: No deformity.  Neurological:     Mental Status: He is alert and oriented to person, place, and time.     Motor: No tremor.     Coordination: Coordination normal.     Gait: Gait normal.  Psychiatric:        Attention and Perception: Attention and perception normal. He is attentive.        Mood and Affect: Mood is not anxious or depressed. Affect is not labile, blunt, angry or inappropriate.        Speech: Speech normal.        Behavior: Behavior normal.        Thought Content: Thought content normal. Thought content does not include homicidal or suicidal ideation. Thought content does not include homicidal or suicidal plan.        Cognition and Memory: Cognition normal.         Judgment: Judgment normal.     Comments: Insightand judgment fair. No auditory or visual hallucinations. No delusions.  OCD worse on clomipramine but so far depression is better.     Lab Review:     Component Value Date/Time   NA 142 07/26/2017 1534   K 4.6 07/26/2017 1534   CL 100 07/26/2017 1534   CO2 22 07/26/2017 1534   GLUCOSE 109 (H) 07/26/2017 1534   GLUCOSE 99 12/11/2014 1119   BUN 11 07/26/2017 1534  CREATININE 1.02 07/26/2017 1534   CREATININE 0.99 12/11/2014 1119   CALCIUM 10.4 (H) 07/26/2017 1534   PROT 8.4 07/26/2017 1534   ALBUMIN 5.0 (H) 07/26/2017 1534   AST 31 07/26/2017 1534   ALT 31 07/26/2017 1534   ALKPHOS 78 07/26/2017 1534   BILITOT 0.8 07/26/2017 1534   GFRNONAA 80 07/26/2017 1534   GFRNONAA 84 12/11/2014 1119   GFRAA 92 07/26/2017 1534   GFRAA >89 12/11/2014 1119       Component Value Date/Time   WBC 10.4 07/26/2017 1534   WBC 9.9 12/11/2014 1130   RBC 5.61 07/26/2017 1534   RBC 5.69 12/11/2014 1130   HGB 18.0 (H) 07/26/2017 1534   HCT 51.8 (H) 07/26/2017 1534   PLT 296 07/26/2017 1534   MCV 92 07/26/2017 1534   MCH 32.1 07/26/2017 1534   MCH 29.3 12/11/2014 1130   MCHC 34.7 07/26/2017 1534   MCHC 31.7 (A) 12/11/2014 1130   RDW 13.7 07/26/2017 1534   LYMPHSABS 3.1 07/26/2017 1534   EOSABS 0.4 07/26/2017 1534   BASOSABS 0.0 07/26/2017 1534    No results found for: POCLITH, LITHIUM   No results found for: PHENYTOIN, PHENOBARB, VALPROATE, CBMZ   .res Assessment: Plan:    Mixed obsessional thoughts and acts  Severe episode of recurrent major depressive disorder, without psychotic features (HCC)   Peyton NajjarLarry got better very quickly with regard to the OCD and anxiety and resolution of suicidal thoughts after starting sertraline and Abilify.  This implies it was likely the Abilify that helped since this is a much quicker acting agent.  He is likely to get more improvement with the sertraline the longer he takes it.  As we reduce the  clomipramine if he has any increase in symptoms of anxiety or depression with then we will increase the sertraline to compensate.  He is aware of this plan.  He agrees to it.  Later we may try to discontinue Abilify but only after he has been better for 3 or 4 months. Reduce the clomipramine to 150 mg DT heartburn and goal of eliminating it.   Disc course progression of OCD and length of therapy required to achieve response.  Treatment of OCD typically requires along trials with serotonin medications.  He has achieved improvement with his depression but not the OCD. Clomipramine apparently helped the depression but not the ocd.   Discussed potential metabolic side effects associated with atypical antipsychotics, as well as potential risk for movement side effects. Advised pt to contact office if movement side effects occur.   No changes inthe lorazepam.  Continue those as you are now.  There is less drug to drug interaction between sertraline and clomipramine than most SSRIs and clomipramine.  I do not want him to lose the and if it from the clomipramine because his symptoms were so severe associated with the depression and suicidal ideation before the clomipramine.  Later it is hoped that the Zoloft will work well enough for both that we can wean the clomipramine.  We discussed the risk of serotonin syndrome with this combination.  Discussed those side effects.  Contact us with any unusual side effects.  Follow-up 6 weeks  This was a 30-minute appointment.  Meredith Staggersarey Cottle MD, DFAPA  Please see After Visit Summary for patient specific instructions.  No future appointments.  No orders of the defined types were placed in this encounter.     -------------------------------

## 2018-04-26 ENCOUNTER — Other Ambulatory Visit: Payer: Self-pay | Admitting: Psychiatry

## 2018-04-26 DIAGNOSIS — F422 Mixed obsessional thoughts and acts: Secondary | ICD-10-CM

## 2018-04-26 DIAGNOSIS — F332 Major depressive disorder, recurrent severe without psychotic features: Secondary | ICD-10-CM

## 2018-05-21 ENCOUNTER — Encounter: Payer: Self-pay | Admitting: Psychiatry

## 2018-05-21 ENCOUNTER — Ambulatory Visit: Payer: BC Managed Care – PPO | Admitting: Psychiatry

## 2018-05-21 DIAGNOSIS — F330 Major depressive disorder, recurrent, mild: Secondary | ICD-10-CM

## 2018-05-21 DIAGNOSIS — F5105 Insomnia due to other mental disorder: Secondary | ICD-10-CM

## 2018-05-21 DIAGNOSIS — F422 Mixed obsessional thoughts and acts: Secondary | ICD-10-CM

## 2018-05-21 DIAGNOSIS — F332 Major depressive disorder, recurrent severe without psychotic features: Secondary | ICD-10-CM

## 2018-05-21 MED ORDER — SERTRALINE HCL 100 MG PO TABS: 200.0000 mg | ORAL_TABLET | Freq: Every day | ORAL | 0 refills | Status: DC

## 2018-05-21 MED ORDER — ARIPIPRAZOLE 5 MG PO TABS: 5.0000 mg | ORAL_TABLET | Freq: Every day | ORAL | 1 refills | Status: DC

## 2018-05-21 MED ORDER — LORAZEPAM 1 MG PO TABS: 1.0000 mg | ORAL_TABLET | Freq: Three times a day (TID) | ORAL | 1 refills | Status: DC

## 2018-05-21 NOTE — Patient Instructions (Signed)
Increase sertraline to 2 daily now  Reduce the clomipramine to 1 capsule for 2 weeks then stop it

## 2018-05-21 NOTE — Progress Notes (Signed)
Mark Estrada [id number] 12-28-1955 63 y.o.  Subjective:   Patient ID:  Mark Estrada is a 63 y.o. (DOB Jul 17, 1955) male.  Chief Complaint:  Chief Complaint  Patient presents with  . Anxiety    OCD  . Depression    Medication Management  . Follow-up    Medication management   last seen April 04, 2018  HPI: Mark Estrada presents to the office today for follow-up of unmanaged OCD.  He has been fairly unstable for several months.  At the last appointment we reduced the clomipramine 250 mg daily with a goal of gradually eliminating it.  He had received some benefit from sertraline plus Abilify.  Zoloft was started early December.  Doing pretty good 7/10 Re: OCD with gradual improvement since here. OCD is in his head, not much checking but thinking "should I?"  But resisits.   Very little depression and managed with activity.  Still not used to all the free time which makes the OCD worse. Sleep adequate with meds.  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  No problems with the depression.  That was mainly in the summer.   Improvement was quick suggesting Abilify helped.  Anxiety is mild now.  Still worries about triggers.  Lorazepam helped sleep somewhat on  HS.  Was able to stop quetiapine ok.  Sleep ok.  Other psych med trials include: Clomipramine helped the depression but not the OCD, Paroxetine 80, fluvoxamine(both of which eventually failed even at high dosage) olanzapine quetiapine aripiprazole risperidone  and for sleep trazodone 50 which failed and temazepam and zolpidem which caused amnesia   Review of Systems:  Review of Systems  Gastrointestinal: Negative for abdominal pain.       Heartburn resolved  Neurological: Negative for tremors and weakness.  Psychiatric/Behavioral: Positive for depression. Negative for agitation, behavioral problems, confusion, decreased  concentration, dysphoric mood, hallucinations, self-injury, sleep disturbance and suicidal ideas. The patient is nervous/anxious. The patient is not hyperactive.     Medications: I have reviewed the patient's current medications.  Current Outpatient Medications  Medication Sig Dispense Refill  . Acetylcysteine 600 MG CAPS Take 1,200 mg by mouth daily.    . ARIPiprazole (ABILIFY) 5 MG tablet Take 1 tablet (5 mg total) by mouth daily. 30 tablet 1  . clomiPRAMINE (ANAFRANIL) 50 MG capsule TAKE 4 CAPSULES(200 MG) BY MOUTH AT BEDTIME (Patient taking differently: Take 100 mg by mouth at bedtime. ) 120 capsule 0  . lisinopril-hydrochlorothiazide (PRINZIDE,ZESTORETIC) 10-12.5 MG tablet Take 1 tablet by mouth daily. 90 tablet 1  . LORazepam (ATIVAN) 1 MG tablet Take 1 tablet (1 mg total) by mouth every 8 (eight) hours. 90 tablet 1  . sertraline (ZOLOFT) 100 MG tablet Take 1.5 tablets (150 mg total) by mouth daily. 1/2 daily for 4 days, then 1 daily for 5 days, then 1 1/2 tablets daily (Patient taking differently: Take 150 mg by mouth daily. ) 45 tablet 1   No current facility-administered medications for this visit.     Medication Side Effects: Other: GI issues.  Miralax helped.  Allergies:  Allergies  Allergen Reactions  . Erythromycin     Itching and hives     Past Medical History:  Diagnosis Date  . Anxiety   . Depression   . OCD (obsessive compulsive disorder) 1998    Family History  Problem Relation Age of Onset  . Heart disease Father   .  Hyperlipidemia Brother     Social History   Socioeconomic History  . Marital status: Married    Spouse name: [name]  . Number of children: 1  . Years of education: Master's  . Highest education level: Not on file  Occupational History  . Occupation: History Teacher    Comment: Psychiatrist  Social Needs  . Financial resource strain: Not on file  . Food insecurity:    Worry: Not on file    Inability: Not on file  .  Transportation needs:    Medical: Not on file    Non-medical: Not on file  Tobacco Use  . Smoking status: Never Smoker  . Smokeless tobacco: Never Used  Substance and Sexual Activity  . Alcohol use: No  . Drug use: No  . Sexual activity: Not on file  Lifestyle  . Physical activity:    Days per week: Not on file    Minutes per session: Not on file  . Stress: Not on file  Relationships  . Social connections:    Talks on phone: Not on file    Gets together: Not on file    Attends religious service: Not on file    Active member of club or organization: Not on file    Attends meetings of clubs or organizations: Not on file    Relationship status: Not on file  . Intimate partner violence:    Fear of current or ex partner: Not on file    Emotionally abused: Not on file    Physically abused: Not on file    Forced sexual activity: Not on file  Other Topics Concern  . Not on file  Social History Narrative   Lives with his wife and their daughter.    Past Medical History, Surgical history, Social history, and Family history were reviewed and updated as appropriate.   Please see review of systems for further details on the patient's review from today.   Objective:   Physical Exam:  There were no vitals taken for this visit.  Physical Exam Constitutional:      General: He is not in acute distress.    Appearance: He is well-developed.  Musculoskeletal:        General: No deformity.  Neurological:     Mental Status: He is alert and oriented to person, place, and time.     Motor: No tremor.     Coordination: Coordination normal.     Gait: Gait normal.  Psychiatric:        Attention and Perception: Attention and perception normal. He is attentive.        Mood and Affect: Mood is anxious. Mood is not depressed. Affect is not labile, blunt, angry or inappropriate.        Speech: Speech normal.        Behavior: Behavior normal.        Thought Content: Thought content normal.  Thought content does not include homicidal or suicidal ideation. Thought content does not include homicidal or suicidal plan.        Cognition and Memory: Cognition normal.        Judgment: Judgment normal.     Comments: Insightand judgment fair. No auditory or visual hallucinations. No delusions.  There is residual OCD but he is improved on the sertraline plus Abilify     Lab Review:     Component Value Date/Time   NA 142 07/26/2017 1534   K 4.6 07/26/2017 1534   CL  100 07/26/2017 1534   CO2 22 07/26/2017 1534   GLUCOSE 109 (H) 07/26/2017 1534   GLUCOSE 99 12/11/2014 1119   BUN 11 07/26/2017 1534   CREATININE 1.02 07/26/2017 1534   CREATININE 0.99 12/11/2014 1119   CALCIUM 10.4 (H) 07/26/2017 1534   PROT 8.4 07/26/2017 1534   ALBUMIN 5.0 (H) 07/26/2017 1534   AST 31 07/26/2017 1534   ALT 31 07/26/2017 1534   ALKPHOS 78 07/26/2017 1534   BILITOT 0.8 07/26/2017 1534   GFRNONAA 80 07/26/2017 1534   GFRNONAA 84 12/11/2014 1119   GFRAA 92 07/26/2017 1534   GFRAA >89 12/11/2014 1119       Component Value Date/Time   WBC 10.4 07/26/2017 1534   WBC 9.9 12/11/2014 1130   RBC 5.61 07/26/2017 1534   RBC 5.69 12/11/2014 1130   HGB 18.0 (H) 07/26/2017 1534   HCT 51.8 (H) 07/26/2017 1534   PLT 296 07/26/2017 1534   MCV 92 07/26/2017 1534   MCH 32.1 07/26/2017 1534   MCH 29.3 12/11/2014 1130   MCHC 34.7 07/26/2017 1534   MCHC 31.7 (A) 12/11/2014 1130   RDW 13.7 07/26/2017 1534   LYMPHSABS 3.1 07/26/2017 1534   EOSABS 0.4 07/26/2017 1534   BASOSABS 0.0 07/26/2017 1534    No results found for: POCLITH, LITHIUM   No results found for: PHENYTOIN, PHENOBARB, VALPROATE, CBMZ   .res Assessment: Plan:    Mixed obsessional thoughts and acts  Depression, major, recurrent, mild (HCC)  Insomnia due to mental condition   Greater than 50% of face to face time with patient was spent on counseling and coordination of care. We discussed the following. Mark Estrada got better very  quickly with regard to the OCD and anxiety and resolution of suicidal thoughts after starting sertraline and Abilify.  He had severe symptoms of OCD and depression set off by his retirement.  As we reduce the clomipramine if he has any increase in symptoms of anxiety or depression with then we will increase the sertraline to compensate.  He is aware of this plan.  He agrees to it.  Later we may try to discontinue Abilify but only after he has been better for 3 or 4 months.  Increase sertraline to 200 mg and further if needed later.  Reduce the clomipramine to 50mg  for 2 weeks then stop it.  Disc course progression of OCD and length of therapy required to achieve response.  Treatment of OCD typically requires along trials with serotonin medications.  He has achieved improvement with his depression but not the OCD. Clomipramine apparently helped the depression but not the ocd.   Discussed potential metabolic side effects associated with atypical antipsychotics, as well as potential risk for movement side effects. Advised pt to contact office if movement side effects occur.  There is none evident at this time.  No changes inthe lorazepam.  Continue those as you are now.  There is less drug to drug interaction between sertraline and clomipramine than most SSRIs and clomipramine.  Discussed serotonin syndrome.  He is not having any of those symptoms at this time.  We are gradually weaning the clomipramine Contact us with any unusual side effects.  Follow-up 6 weeks  This was a 30-minute appointment.  Meredith Staggers MD, DFAPA  Please see After Visit Summary for patient specific instructions.  No future appointments.  No orders of the defined types were placed in this encounter.     -------------------------------

## 2018-05-26 ENCOUNTER — Telehealth: Payer: Self-pay | Admitting: Psychiatry

## 2018-05-26 NOTE — Telephone Encounter (Signed)
It was submitted on 02/26 to pharmacy

## 2018-05-26 NOTE — Telephone Encounter (Signed)
Pt left v-mail. Lorazepam #90 denied.

## 2018-05-27 NOTE — Telephone Encounter (Signed)
Just picked up rx on 02/18 for #90 plus another was submitted on 02/26 for next fill.

## 2018-05-27 NOTE — Telephone Encounter (Signed)
Left voicemail with information

## 2018-06-18 ENCOUNTER — Telehealth: Payer: Self-pay | Admitting: Psychiatry

## 2018-06-18 NOTE — Telephone Encounter (Signed)
Pt having a real hard time. Wife wants to talk to you to see which direction she should take.

## 2018-06-19 ENCOUNTER — Other Ambulatory Visit: Payer: Self-pay

## 2018-06-19 DIAGNOSIS — F422 Mixed obsessional thoughts and acts: Secondary | ICD-10-CM

## 2018-06-19 MED ORDER — LORAZEPAM 1 MG PO TABS: 1.0000 mg | ORAL_TABLET | Freq: Three times a day (TID) | ORAL | 1 refills | Status: DC

## 2018-06-19 NOTE — Telephone Encounter (Signed)
Spoke to Pt. C/O increased anxiety and diarrhea x 1 month. States Not SI or HI. States " drinking enough fluids". Asking what is the next step: to increase medication or move up appt.? Pharmacy given reorder for Ativan. Pt would like you (Dr C) to call him or give nurse message for him.

## 2018-06-19 NOTE — Telephone Encounter (Signed)
For increased anxiety increase sertraline to 2-1/2 tablets daily and increase Abilify to 10 mg daily.  Continue to use lorazepam as needed if it is helpful.  It will take a week or 2 to be helpful probably.

## 2018-06-20 ENCOUNTER — Other Ambulatory Visit: Payer: Self-pay

## 2018-06-20 DIAGNOSIS — F332 Major depressive disorder, recurrent severe without psychotic features: Secondary | ICD-10-CM

## 2018-06-20 DIAGNOSIS — F422 Mixed obsessional thoughts and acts: Secondary | ICD-10-CM

## 2018-06-20 MED ORDER — SERTRALINE HCL 100 MG PO TABS: 250.0000 mg | ORAL_TABLET | Freq: Every day | ORAL | 0 refills | Status: DC

## 2018-06-20 MED ORDER — ARIPIPRAZOLE 10 MG PO TABS: 10.0000 mg | ORAL_TABLET | Freq: Every day | ORAL | 0 refills | Status: DC

## 2018-06-20 NOTE — Telephone Encounter (Signed)
RTC to wife but she says Bonita Quin called "him" (pt) back yesterday which was a "no no" so she says I need to call pt back on his cell phone with changes. Nurse called spoke to pt and verbalized understanding of dose increase. Encouraged fluids to help with diarrhea, probably anxiety related. Instructed to call back with further problems or concerns.

## 2018-07-01 ENCOUNTER — Telehealth: Payer: Self-pay | Admitting: Psychiatry

## 2018-07-01 NOTE — Telephone Encounter (Signed)
He has an appointment with me in 2 days.  I would advise taking Imodium 1/2-1 daily as needed and he can reduce the Zoloft back to 1-1/2 daily until the appointment. Zoloft can cause diarrhea  We will deal with it at his appointment.

## 2018-07-01 NOTE — Telephone Encounter (Signed)
Spoke with him approximately 1-1 1/2 weeks ago about diarrhea, thought then anxiety/stress related. Any changes?

## 2018-07-01 NOTE — Telephone Encounter (Signed)
Patient is experiencing diarrhea (61mos approximate).Think it may be a side effect from his meds. Very concerned. Please advise.

## 2018-07-02 NOTE — Telephone Encounter (Signed)
Pt aware and verified instructions. Will discuss tomorrow.

## 2018-07-03 ENCOUNTER — Encounter: Payer: Self-pay | Admitting: Psychiatry

## 2018-07-03 ENCOUNTER — Ambulatory Visit (INDEPENDENT_AMBULATORY_CARE_PROVIDER_SITE_OTHER): Payer: BC Managed Care – PPO | Admitting: Psychiatry

## 2018-07-03 ENCOUNTER — Other Ambulatory Visit: Payer: Self-pay

## 2018-07-03 DIAGNOSIS — F5105 Insomnia due to other mental disorder: Secondary | ICD-10-CM

## 2018-07-03 DIAGNOSIS — F422 Mixed obsessional thoughts and acts: Secondary | ICD-10-CM | POA: Diagnosis not present

## 2018-07-03 DIAGNOSIS — F330 Major depressive disorder, recurrent, mild: Secondary | ICD-10-CM

## 2018-07-03 MED ORDER — SERTRALINE HCL 100 MG PO TABS: ORAL_TABLET | ORAL | 0 refills | Status: DC

## 2018-07-03 NOTE — Progress Notes (Signed)
Mark GirtLawrence Estrada [id number] 12/18/1955 63 y.o.  Subjective:   Patient ID:  Mark Estrada is a 63 y.o. (DOB 10/21/1955) male.  Chief Complaint:  Chief Complaint  Patient presents with  . Medication Problem    diarrhea  . Anxiety  . Depression  . ocd worse    HPI: Mark Estrada presents to the office today for follow-up of unmanaged OCD.  He has been fairly unstable for several months.  And at the last visit May 21, 2018 we increased sertraline from 150 to 200 mg a day  And then 250 about a month ago.  wean off the clomipramine since it did not seem necessary to continue both.  He is called back since 2 days ago complaining of diarrhea and wanting a medicine change.  Earlier this year started obsessing on stool and it's gotten completely out of hand.  All I can think about bc I think i've gotten something serious.  Loose stool.  Only 1 BM daily.  OCD is much  Worse and sitting at home DT Covid has made it really bad.  At the last appointment we reduced the clomipramine 250 mg daily with a goal of gradually eliminating it.  He had received some benefit from sertraline plus Abilify.  Zoloft was started early December.   Sleep adequate with meds.  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  No problems with the depression.  That was mainly in the summer.   Improvement was quick suggesting Abilify helped.  Anxiety is mild now.  Still worries about triggers.  Lorazepam helped sleep somewhat on 2mg  HS.  Was able to stop quetiapine ok.  Sleep ok.  Other psych med trials include: Clomipramine helped the depression but not the OCD, Paroxetine 80, fluvoxamine(both of which eventually failed even at high dosage) olanzapine quetiapine aripiprazole risperidone  and for sleep trazodone 50 which failed and temazepam and zolpidem which caused amnesia   Review of Systems:  Review of Systems   Gastrointestinal: Negative for abdominal distention, abdominal pain, constipation and nausea.       Heartburn resolved  Neurological: Negative for tremors and weakness.  Psychiatric/Behavioral: Negative for agitation, behavioral problems, confusion, decreased concentration, dysphoric mood, hallucinations, self-injury, sleep disturbance and suicidal ideas. The patient is nervous/anxious. The patient is not hyperactive.     Medications: I have reviewed the patient's current medications.  Current Outpatient Medications  Medication Sig Dispense Refill  . Acetylcysteine 600 MG CAPS Take 1,200 mg by mouth daily.    . ARIPiprazole (ABILIFY) 10 MG tablet Take 1 tablet (10 mg total) by mouth daily. (Patient taking differently: Take 20 mg by mouth daily. ) 90 tablet 0  . lisinopril-hydrochlorothiazide (PRINZIDE,ZESTORETIC) 10-12.5 MG tablet Take 1 tablet by mouth daily. 90 tablet 1  . LORazepam (ATIVAN) 1 MG tablet Take 1 tablet (1 mg total) by mouth every 8 (eight) hours. (Patient taking differently: Take 2 mg by mouth at bedtime. ) 90 tablet 1  . sertraline (ZOLOFT) 100 MG tablet Take 2.5 tablets (250 mg total) by mouth daily. 225 tablet 0   No current facility-administered medications for this visit.     Medication Side Effects: Other: GI issues.  Miralax helped.  Allergies:  Allergies  Allergen Reactions  . Erythromycin     Itching and hives     Past Medical History:  Diagnosis Date  . Anxiety   . Depression   . OCD (obsessive compulsive  disorder) 1998    Family History  Problem Relation Age of Onset  . Heart disease Father   . Hyperlipidemia Brother     Social History   Socioeconomic History  . Marital status: Married    Spouse name: [name]  . Number of children: 1  . Years of education: Master's  . Highest education level: Not on file  Occupational History  . Occupation: History Teacher    Comment: Psychiatrist  Social Needs  . Financial resource strain:  Not on file  . Food insecurity:    Worry: Not on file    Inability: Not on file  . Transportation needs:    Medical: Not on file    Non-medical: Not on file  Tobacco Use  . Smoking status: Never Smoker  . Smokeless tobacco: Never Used  Substance and Sexual Activity  . Alcohol use: No  . Drug use: No  . Sexual activity: Not on file  Lifestyle  . Physical activity:    Days per week: Not on file    Minutes per session: Not on file  . Stress: Not on file  Relationships  . Social connections:    Talks on phone: Not on file    Gets together: Not on file    Attends religious service: Not on file    Active member of club or organization: Not on file    Attends meetings of clubs or organizations: Not on file    Relationship status: Not on file  . Intimate partner violence:    Fear of current or ex partner: Not on file    Emotionally abused: Not on file    Physically abused: Not on file    Forced sexual activity: Not on file  Other Topics Concern  . Not on file  Social History Narrative   Lives with his wife and their daughter.    Past Medical History, Surgical history, Social history, and Family history were reviewed and updated as appropriate.   Please see review of systems for further details on the patient's review from today.   Objective:   Physical Exam:  There were no vitals taken for this visit.  Physical Exam Neurological:     Mental Status: He is alert and oriented to person, place, and time.     Cranial Nerves: No dysarthria.  Psychiatric:        Attention and Perception: Attention normal.        Mood and Affect: Mood is anxious and depressed.        Speech: Speech normal.        Behavior: Behavior is cooperative.        Thought Content: Thought content is not paranoid or delusional. Thought content does not include homicidal or suicidal ideation. Thought content does not include homicidal or suicidal plan.        Cognition and Memory: Cognition and memory  normal.        Judgment: Judgment normal.     Comments: Increased obsessions and checking now on stool and fear illness.     Lab Review:     Component Value Date/Time   NA 142 07/26/2017 1534   K 4.6 07/26/2017 1534   CL 100 07/26/2017 1534   CO2 22 07/26/2017 1534   GLUCOSE 109 (H) 07/26/2017 1534   GLUCOSE 99 12/11/2014 1119   BUN 11 07/26/2017 1534   CREATININE 1.02 07/26/2017 1534   CREATININE 0.99 12/11/2014 1119   CALCIUM 10.4 (H) 07/26/2017 1534  PROT 8.4 07/26/2017 1534   ALBUMIN 5.0 (H) 07/26/2017 1534   AST 31 07/26/2017 1534   ALT 31 07/26/2017 1534   ALKPHOS 78 07/26/2017 1534   BILITOT 0.8 07/26/2017 1534   GFRNONAA 80 07/26/2017 1534   GFRNONAA 84 12/11/2014 1119   GFRAA 92 07/26/2017 1534   GFRAA >89 12/11/2014 1119       Component Value Date/Time   WBC 10.4 07/26/2017 1534   WBC 9.9 12/11/2014 1130   RBC 5.61 07/26/2017 1534   RBC 5.69 12/11/2014 1130   HGB 18.0 (H) 07/26/2017 1534   HCT 51.8 (H) 07/26/2017 1534   PLT 296 07/26/2017 1534   MCV 92 07/26/2017 1534   MCH 32.1 07/26/2017 1534   MCH 29.3 12/11/2014 1130   MCHC 34.7 07/26/2017 1534   MCHC 31.7 (A) 12/11/2014 1130   RDW 13.7 07/26/2017 1534   LYMPHSABS 3.1 07/26/2017 1534   EOSABS 0.4 07/26/2017 1534   BASOSABS 0.0 07/26/2017 1534    No results found for: POCLITH, LITHIUM   No results found for: PHENYTOIN, PHENOBARB, VALPROATE, CBMZ   .res Assessment: Plan:    Mixed obsessional thoughts and acts  Depression, major, recurrent, mild (HCC)  Insomnia due to mental condition   Greater than 50% of face to face time with patient was spent on counseling and coordination of care. We discussed the following. Peyton Najjar got better very quickly with regard to the OCD and anxiety and resolution of suicidal thoughts after starting sertraline and Abilify.  He had severe symptoms of OCD and depression set off by his retirement.   Later we may try to discontinue Abilify but only after he has been  better for 3 or 4 months.  More recently he has seen gradual increase in obsessions over his stool and checking with fear of having some serious underlying disease for which there is no current medical evidence or symptoms to support.  He recognizes OCD but he cannot control it.  It is made worse by the fact that he is more idle because of the Covid virus is not able to work.  His OCD symptoms are always worse when he is not working much.  Discussed the study from several years ago in which Zoloft was studied for OCD up to 400 mg daily Soo showing a dose-dependent improvement as the dosage went higher.  It was also well tolerated.  We have discussed serotonin syndrome and is aware of that risk.  He wants to pursue the higher dose in order to better control his OCD.  Increase sertraline to 300 mg (1 in AM and 2 HS).  Discussed  Don't restart clomipramine.  Reduce Aripiprazole back to 10 mg daily.  He had increased it to 20 mg by mistake.  Try to stay busy bc lack of activity worsens his OCD.  Disc course progression of OCD and length of therapy required to achieve response.  Treatment of OCD typically requires along trials with serotonin medications.  He has achieved improvement with his depression but not the OCD. Clomipramine apparently helped the depression but not the ocd.  His depression is not worse off the clomipramine.  His OCD is worse but I believe that is coincidental because the clomipramine never seem to help his OCD.  Discussed potential metabolic side effects associated with atypical antipsychotics, as well as potential risk for movement side effects. Advised pt to contact office if movement side effects occur.  There is none evident at this time.  No changes inthe  lorazepam.  Continue those as you are now. We discussed the short-term risks associated with benzodiazepines including sedation and increased fall risk among others.  Discussed long-term side effect risk including  dependence, potential withdrawal symptoms, and the potential eventual dose-related risk of dementia.  Contact us with any unusual side effects.  Follow-up 6 weeks  This was a 30-minute appointment.  I connected with patient by a video enabled telemedicine application or telephone, with their informed consent, and verified patient privacy and that I am speaking with the correct person using two identifiers.  I was located at office and patient at home.  Meredith Staggers MD, DFAPA  Please see After Visit Summary for patient specific instructions.  No future appointments.  No orders of the defined types were placed in this encounter.     -------------------------------

## 2018-10-01 ENCOUNTER — Other Ambulatory Visit: Payer: Self-pay | Admitting: Family Medicine

## 2018-10-01 DIAGNOSIS — R634 Abnormal weight loss: Secondary | ICD-10-CM

## 2018-10-06 ENCOUNTER — Other Ambulatory Visit: Payer: Self-pay

## 2018-11-17 ENCOUNTER — Other Ambulatory Visit: Payer: Self-pay | Admitting: Psychiatry

## 2018-11-17 DIAGNOSIS — F332 Major depressive disorder, recurrent severe without psychotic features: Secondary | ICD-10-CM

## 2018-11-17 DIAGNOSIS — F422 Mixed obsessional thoughts and acts: Secondary | ICD-10-CM

## 2018-11-18 ENCOUNTER — Telehealth: Payer: Self-pay | Admitting: Psychiatry

## 2018-11-18 NOTE — Telephone Encounter (Signed)
Wife called saying they need a copy of all his medications ever taken/prescribed from the beginning including over the counter medications.  They are trying to see whats been used, what has worked and what path they should  Be taking.  He is not doing well.  Has appt 12/10/18.  Would like this list prior to appt for review.

## 2018-11-18 NOTE — Telephone Encounter (Signed)
Other psych med trials include: Clomipramine helped the depression but not the OCD,Paroxetine 80, fluvoxamine(both of which eventually failed even at high dosage) olanzapine, quetiapine, aripiprazole 20 mg,  Risperidone, sertraline 300  and for sleep trazodone 50 which failed and temazepam and zolpidem which caused amnesia    If he is seeking a second opinion, and I would certainly support this, if he signed a release of information form I can send a copy of the entire chart to any other psychiatrist of his choice to review.

## 2018-11-20 NOTE — Telephone Encounter (Signed)
I have sent medication list and attached note to patient

## 2018-12-10 ENCOUNTER — Other Ambulatory Visit: Payer: Self-pay

## 2018-12-10 ENCOUNTER — Encounter: Payer: Self-pay | Admitting: Psychiatry

## 2018-12-10 ENCOUNTER — Ambulatory Visit (INDEPENDENT_AMBULATORY_CARE_PROVIDER_SITE_OTHER): Payer: BC Managed Care – PPO | Admitting: Psychiatry

## 2018-12-10 DIAGNOSIS — F422 Mixed obsessional thoughts and acts: Secondary | ICD-10-CM

## 2018-12-10 DIAGNOSIS — F331 Major depressive disorder, recurrent, moderate: Secondary | ICD-10-CM

## 2018-12-10 DIAGNOSIS — F5105 Insomnia due to other mental disorder: Secondary | ICD-10-CM | POA: Diagnosis not present

## 2018-12-10 NOTE — Patient Instructions (Signed)
Stop Abilify to see if it's causing fatigue.  If depression or anxiety worsens, then start samples of Rexulti 1 mg for 1 week, then 2 mg daily.

## 2018-12-10 NOTE — Progress Notes (Signed)
Mark GirtLawrence Lerch [id number] 01/09/1956 63 y.o.  Subjective:   Patient ID:  Mark Estrada is a 63 y.o. (DOB 10/17/1955) male.  Chief Complaint:  Chief Complaint  Patient presents with  . Follow-up     Medication Management  . Anxiety     Medication Management  . Depression     Medication Management    HPI: Mark GirtLawrence Righi presents to the office today for follow-up of unmanaged depression and OCD.  He has been fairly unstable for several months.  And at the last visit May 21, 2018 we increased sertraline from 150 to 200 mg a day  And then 250 about a month ago.  wean off the clomipramine since it did not seem necessary to continue both.  Last seen in April when it was recommended he increase sertraline to 300 mg and Abilify to 10 mg daily.  W wants him to get a 2nd opinion.  Disc this.  Her concern is he is not himself, gloomy and low energy and motivation  Addnl stress Covid affecting income and therefore more idle as well which doesn't help.  3 online classes being taught. Wife lost her job.     Pt reports that mood is Anxious, Depressed, Irritable, Worthless and helpless and describes anxiety as Moderate. Depression is milder.  Anxiety symptoms include: Excessive Worry, Obsessive Compulsive Symptoms:   without checking but obsessing over not enough money, worry. Pt reports no sleep issues and 7 hours with Ativan. Pt reports that appetite is good. Pt reports that energy is poor and anhedonia, loss of interest or pleasure in usual activities, poor motivation and withdrawn from usual activities. Concentration is poor. Suicidal thoughts:  denied by patient and occ death thoughts. Also some cognitive problems with mistakes and oversights that are unusual for him.  Earlier this year started obsessing on stool but that is better but not gone.  All I can think about bc I think i've gotten something serious.  Loose stool.  Only 1 BM daily unless stressed then 2 daily.  Had  colonoscopy and CT scan normal.    Zoloft was started early December.  Other psych med trials include: Clomipramine helped the depression but not the OCD, Paroxetine 80, fluvoxamine(both of which eventually failed even at high dosage) olanzapine quetiapine aripiprazole risperidone  and for sleep trazodone 50 which failed and temazepam and zolpidem which caused amnesia    Review of Systems:  Review of Systems  Gastrointestinal: Positive for diarrhea. Negative for abdominal distention, abdominal pain, constipation and nausea.       Heartburn resolved  Neurological: Negative for tremors and weakness.  Psychiatric/Behavioral: Positive for dysphoric mood. Negative for agitation, behavioral problems, confusion, decreased concentration, hallucinations, self-injury, sleep disturbance and suicidal ideas. The patient is nervous/anxious. The patient is not hyperactive.     Medications: I have reviewed the patient's current medications.  Current Outpatient Medications  Medication Sig Dispense Refill  . Acetylcysteine 600 MG CAPS Take 1,200 mg by mouth daily.    . ARIPiprazole (ABILIFY) 10 MG tablet TAKE 1 TABLET(10 MG) BY MOUTH DAILY 90 tablet 0  . LORazepam (ATIVAN) 1 MG tablet Take 1 tablet (1 mg total) by mouth every 8 (eight) hours. (Patient taking differently: Take 2 mg by mouth at bedtime. ) 90 tablet 1  . propranolol ER (INDERAL LA) 120 MG 24 hr capsule TK 1 C PO D    . sertraline (ZOLOFT) 100 MG tablet 1 in the morning and 2 at night 270 tablet 0  No current facility-administered medications for this visit.     Medication Side Effects: Other: GI issues.  Miralax helped.  Allergies:  Allergies  Allergen Reactions  . Erythromycin     Itching and hives     Past Medical History:  Diagnosis Date  . Anxiety   . Depression   . OCD (obsessive compulsive disorder) 1998    Family History  Problem Relation Age of Onset  . Heart disease Father   . Hyperlipidemia Brother     Social  History   Socioeconomic History  . Marital status: Married    Spouse name: [name]  . Number of children: 1  . Years of education: Master's  . Highest education level: Not on file  Occupational History  . Occupation: History Teacher    Comment: [employer]  Social Needs  . Financial resource strain: Not on file  . Food insecurity    Worry: Not on file    Inability: Not on file  . Transportation needs    Medical: Not on file    Non-medical: Not on file  Tobacco Use  . Smoking status: Never Smoker  . Smokeless tobacco: Never Used  Substance and Sexual Activity  . Alcohol use: No  . Drug use: No  . Sexual activity: Not on file  Lifestyle  . Physical activity    Days per week: Not on file    Minutes per session: Not on file  . Stress: Not on file  Relationships  . Social Musicianconnections    Talks on phone: Not on file    Gets together: Not on file    Attends religious service: Not on file    Active member of club or organization: Not on file    Attends meetings of clubs or organizations: Not on file    Relationship status: Not on file  . Intimate partner violence    Fear of current or ex partner: Not on file    Emotionally abused: Not on file    Physically abused: Not on file    Forced sexual activity: Not on file  Other Topics Concern  . Not on file  Social History Narrative   Lives with his wife and their daughter.    Past Medical History, Surgical history, Social history, and Family history were reviewed and updated as appropriate.   Please see review of systems for further details on the patient's review from today.   Objective:   Physical Exam:  There were no vitals taken for this visit.  Physical Exam Constitutional:      General: He is not in acute distress.    Appearance: He is well-developed.  Musculoskeletal:        General: No deformity.  Neurological:     Mental Status: He is alert and oriented to person, place, and time.     Cranial  Nerves: No dysarthria.     Motor: No tremor.     Coordination: Coordination normal.  Psychiatric:        Attention and Perception: Attention and perception normal. He does not perceive auditory or visual hallucinations.        Mood and Affect: Mood is anxious. Mood is not depressed. Affect is blunt. Affect is not labile, angry or inappropriate.        Speech: Speech normal.        Behavior: Behavior normal. Behavior is cooperative.        Thought Content: Thought content is not paranoid or delusional. Thought  content does not include homicidal or suicidal ideation. Thought content does not include homicidal or suicidal plan.        Cognition and Memory: Cognition and memory normal.        Judgment: Judgment normal.     Comments: OCD is noted.  His affect is less anxious but more blunted than at the last visit.     Lab Review:     Component Value Date/Time   NA 142 07/26/2017 1534   K 4.6 07/26/2017 1534   CL 100 07/26/2017 1534   CO2 22 07/26/2017 1534   GLUCOSE 109 (H) 07/26/2017 1534   GLUCOSE 99 12/11/2014 1119   BUN 11 07/26/2017 1534   CREATININE 1.02 07/26/2017 1534   CREATININE 0.99 12/11/2014 1119   CALCIUM 10.4 (H) 07/26/2017 1534   PROT 8.4 07/26/2017 1534   ALBUMIN 5.0 (H) 07/26/2017 1534   AST 31 07/26/2017 1534   ALT 31 07/26/2017 1534   ALKPHOS 78 07/26/2017 1534   BILITOT 0.8 07/26/2017 1534   GFRNONAA 80 07/26/2017 1534   GFRNONAA 84 12/11/2014 1119   GFRAA 92 07/26/2017 1534   GFRAA >89 12/11/2014 1119       Component Value Date/Time   WBC 10.4 07/26/2017 1534   WBC 9.9 12/11/2014 1130   RBC 5.61 07/26/2017 1534   RBC 5.69 12/11/2014 1130   HGB 18.0 (H) 07/26/2017 1534   HCT 51.8 (H) 07/26/2017 1534   PLT 296 07/26/2017 1534   MCV 92 07/26/2017 1534   MCH 32.1 07/26/2017 1534   MCH 29.3 12/11/2014 1130   MCHC 34.7 07/26/2017 1534   MCHC 31.7 (A) 12/11/2014 1130   RDW 13.7 07/26/2017 1534   LYMPHSABS 3.1 07/26/2017 1534   EOSABS 0.4 07/26/2017  1534   BASOSABS 0.0 07/26/2017 1534    No results found for: POCLITH, LITHIUM   No results found for: PHENYTOIN, PHENOBARB, VALPROATE, CBMZ   .res Assessment: Plan:    Mixed obsessional thoughts and acts  Major depressive disorder, recurrent episode, moderate (HCC)  Insomnia due to mental condition  Rule out excessive sedation fatigue from medication  Greater than 50% of 30 min face to face time with patient was spent on counseling and coordination of care. We discussed the following.  Agree with seeking 2 nd opinion for TR OCD and depression.  Discussed options.  Also he can bring his wife if he thinks that will help.  Fritz Pickerel got better very quickly with regard to the OCD and anxiety and resolution of suicidal thoughts after starting sertraline and Abilify.  He had severe symptoms of OCD and depression set off by his retirement.   However he is bothered now by continued general worry and some obsessing especially about money but no significant checking.  He is also bothered by low motivation and low interest and mild cognitive problems.  He wonders if it could be related to medication.  The previous obsessions on his stool are not as severe as when he was last here.  Overall it appears to me that he has had improvement in the OCD but has general worry and the other problems noted above.  Discussed options.  It would be very risky to change the sertraline especially given he is tried all the major alternatives except fluoxetine which does not tend to be as effective.  Therefore we will assess whether or not he is having side effects by attempting to discontinue the Abilify.  Discussed the study from several years ago in which  Zoloft was studied for OCD up to 400 mg daily Soo showing a dose-dependent improvement as the dosage went higher.  It was also well tolerated.  We have discussed serotonin syndrome and is aware of that risk.  He wants to pursue the higher dose in order to better control  his OCD.  Offered to give him a copy of this article.  Continue sertraline  300 mg (1 in AM and 2 HS).  Discussed the higher than usual dosage and studies that support this.  It has helped the OCD but still a lot of general worry.  Stop Abilify to see if it's causing fatigue.  If depression or anxiety worsens, then start samples of Rexulti 1 mg for 1 week, then 2 mg daily.  Try to stay busy bc lack of activity worsens his OCD.  Disc course progression of OCD and length of therapy required to achieve response.  Treatment of OCD typically requires along trials with serotonin medications.   Discussed potential metabolic side effects associated with atypical antipsychotics, as well as potential risk for movement side effects. Advised pt to contact office if movement side effects occur.  There is none evident at this time.  No changes inthe lorazepam.  Continue those as you are now. We discussed the short-term risks associated with benzodiazepines including sedation and increased fall risk among others.  Discussed long-term side effect risk including dependence, potential withdrawal symptoms, and the potential eventual dose-related risk of dementia.  Contact us with any unusual side effects or if symptoms worsen significantly off of the Abilify.  We discussed that risk.  Discussed the importance of not letting his symptoms drag on for months at a time if he is not doing well.  He is not been seen since April which is not good given that he is not well.  Follow-up 8 weeks  Meredith Staggers MD, DFAPA  Please see After Visit Summary for patient specific instructions.  Future Appointments  Date Time Provider Department Center  02/09/2019  3:30 PM Cottle, Steva Ready., MD [facility] None    No orders of the defined types were placed in this encounter.     -------------------------------

## 2018-12-20 ENCOUNTER — Other Ambulatory Visit: Payer: Self-pay | Admitting: Psychiatry

## 2018-12-20 DIAGNOSIS — F422 Mixed obsessional thoughts and acts: Secondary | ICD-10-CM

## 2018-12-22 ENCOUNTER — Ambulatory Visit
Admission: EM | Admit: 2018-12-22 | Discharge: 2018-12-22 | Disposition: A | Discharge status: Home / Self Care | Payer: BC Managed Care – PPO

## 2018-12-22 NOTE — ED Notes (Signed)
Spoke with patient, denies any symptoms that led him to checking in for a prostate exam.  This RN made him aware we are unable to do that sort of exam here, and patient verbalized understanding.  Encouraged him to follow up with PCP next door to make an appointment.  Patient to walk next door at this time to make an appointment.

## 2018-12-22 NOTE — Telephone Encounter (Signed)
Last office note says 2 mg at hs, change directions and quantity?

## 2018-12-23 NOTE — Telephone Encounter (Signed)
Rx called in due to error transmission

## 2018-12-24 ENCOUNTER — Telehealth: Payer: Self-pay | Admitting: Psychiatry

## 2018-12-24 NOTE — Telephone Encounter (Signed)
Sidi called to inquire about his refill on his lorazepam.  He said the pharmacy doesn't have it yet.  He is out today.  In epic it looks it was sent Monday but the transmission failed.  Please send again.

## 2018-12-24 NOTE — Telephone Encounter (Signed)
I called this Rx in yesterday since it failed to send will call in again.

## 2019-01-05 ENCOUNTER — Telehealth: Payer: Self-pay | Admitting: Psychiatry

## 2019-01-05 NOTE — Telephone Encounter (Signed)
Pt has samples of Rexulti and he does not know how many he should take. Would like a call back.

## 2019-01-05 NOTE — Telephone Encounter (Signed)
Patient's last office visit 12/10/2018 received Rexulti 1 mg samples, instructed to start with 1 mg daily of Rexulti for 7 days, then increase to 2 mg Rexulti daily. Pt verified understanding and instructed to call back and let us know how it's working and to get refills

## 2019-01-14 ENCOUNTER — Telehealth: Payer: Self-pay | Admitting: Psychiatry

## 2019-01-14 ENCOUNTER — Other Ambulatory Visit: Payer: Self-pay

## 2019-01-14 MED ORDER — BREXPIPRAZOLE 2 MG PO TABS: 2.0000 mg | ORAL_TABLET | Freq: Every day | ORAL | 1 refills | Status: DC

## 2019-01-14 NOTE — Telephone Encounter (Signed)
Refill submitted, will plan for a prior authorization request from pharmacy

## 2019-01-14 NOTE — Telephone Encounter (Signed)
Patient called and said that he was on samples of the rexulti 2 mg and would lik a script sent in to to [facility] on [address]

## 2019-01-16 ENCOUNTER — Telehealth: Payer: Self-pay

## 2019-01-16 NOTE — Telephone Encounter (Signed)
Prior authorization submitted and approved for Rexulti j2 mg effective 01/16/2019-01/15/2022 through [facility]

## 2019-02-03 ENCOUNTER — Telehealth: Payer: Self-pay | Admitting: Psychiatry

## 2019-02-03 NOTE — Telephone Encounter (Signed)
Nonie Hoyer Larry's spouse would like to speak with Dr Clovis Pu about concerns she have #[phone]

## 2019-02-03 NOTE — Telephone Encounter (Signed)
I will attempt to get in touch with her before his appointment.  However it would be much more helpful if she could come to his appointment with him so we could have a dialogue among the 3 of [place].  Please invite her to come to the appointment

## 2019-02-04 ENCOUNTER — Other Ambulatory Visit: Payer: Self-pay | Admitting: Psychiatry

## 2019-02-04 DIAGNOSIS — F422 Mixed obsessional thoughts and acts: Secondary | ICD-10-CM

## 2019-02-06 ENCOUNTER — Telehealth: Payer: Self-pay | Admitting: Psychiatry

## 2019-02-06 NOTE — Telephone Encounter (Signed)
Mark Estrada is going to come to the appt with him Nov 16th, but Fritz Pickerel is against it because he feels it will be a gang up against him. She is not sure what to do. He is acting bizarre.

## 2019-02-09 ENCOUNTER — Other Ambulatory Visit: Payer: Self-pay

## 2019-02-09 ENCOUNTER — Ambulatory Visit (INDEPENDENT_AMBULATORY_CARE_PROVIDER_SITE_OTHER): Payer: BC Managed Care – PPO | Admitting: Psychiatry

## 2019-02-09 ENCOUNTER — Encounter: Payer: Self-pay | Admitting: Psychiatry

## 2019-02-09 DIAGNOSIS — F5105 Insomnia due to other mental disorder: Secondary | ICD-10-CM

## 2019-02-09 DIAGNOSIS — F331 Major depressive disorder, recurrent, moderate: Secondary | ICD-10-CM | POA: Diagnosis not present

## 2019-02-09 DIAGNOSIS — F422 Mixed obsessional thoughts and acts: Secondary | ICD-10-CM

## 2019-02-09 MED ORDER — LORAZEPAM 1 MG PO TABS: 2.0000 mg | ORAL_TABLET | Freq: Every day | ORAL | 1 refills | Status: DC

## 2019-02-09 NOTE — Progress Notes (Signed)
Mark Estrada [id number] Apr 02, 1955 63 y.o.  Subjective:   Patient ID:  Mark Estrada is a 63 y.o. (DOB 1955/08/08) male.  Chief Complaint:  Chief Complaint  Patient presents with  . Follow-up    Medication Management  . Anxiety    Medication Management  . Depression    Medication Management  . Fatigue    HPI: Mark Estrada presents to the office today for follow-up of unmanaged depression and OCD.  He has been fairly unstable for several months.  And at visit May 21, 2018 we increased sertraline from 150 to 200 mg a day  And then 250 about a month ago.  wean off the clomipramine since it did not seem necessary to continue both.  in April when it was recommended he increase sertraline to 300 mg and Abilify to 10 mg daily.  Last seen September 2020.  The following adjustments were made: Continue sertraline  300 mg (1 in AM and 2 HS).  Discussed the higher than usual dosage and studies that support this.  It has helped the OCD but still a lot of general worry. Stop Abilify to see if it's causing fatigue. If depression or anxiety worsens, then start samples of Rexulti 1 mg for 1 week, then 2 mg daily.  W wants him to get a 2nd opinion.  Disc this.  Her concern is he is not himself, gloomy and low energy and motivation  Seen with wife Mark Estrada.  Energy no better off Abilify and gradually depression and anxiety got worse.  Mind racing at night interferes with sleep and gets 2-3 hours of sleep.  Repeated awakenings.    Wife sees high anxiety without changes with Abilify.  She notes he's quieter and will ask her for reassurance stated again lately but not as bad as in the past.   He's less motivated and interested and active overall. W concerned he's not improving at all and making no effort.  He's getting weaker.  Tremor and thinking is not clear and trouble with decisions.   Not bathing or brushing his teeth.  Will go a week without a shower.  Noticing lying or  nothing.  Not normal for him.  No friends, hobbies or activity unless forced.    Last couple of years periods of mania with excessive spending and hyperverbal speech.  He never spends money.  This happened when he retired.  Talked pressured and lasted a month or more.  She thinks it happened 2 years ago after  [place] stopping some meds.  Addnl stress Covid affecting income and therefore more idle as well which doesn't help.  3 online classes being taught. Wife lost her job.     Pt reports that mood is anhedonia,  Anxious, Depressed, Irritable, Worthless and helpless and describes anxiety as Moderate. Depression is milder.  Anxiety symptoms include: Excessive Worry, Obsessive Compulsive Symptoms:   without checking but obsessing over not enough money, worry. Pt reports no sleep issues and 7 hours with Ativan. Pt reports that appetite is good. Pt reports that energy is poor and anhedonia, loss of interest or pleasure in usual activities, poor motivation and withdrawn from usual activities. Concentration is poor. Suicidal thoughts:  denied by patient and occ death thoughts. Also some cognitive problems with mistakes and oversights that are unusual for him.  Earlier this year started obsessing on stool but that is better but not gone.  All I can think about bc I think i've gotten something serious.  Loose stool.  Only 1 BM daily unless stressed then 2 daily.  Had colonoscopy and CT scan normal.    Zoloft was started early December 2019.  Other psych med trials include: Clomipramine helped the depression but not the OCD,  Paroxetine 80,  fluvoxamine(both of which eventually failed even at high dosage)  Olanzapine,  Quetiapine,  Aripiprazole, risperidone  and for sleep trazodone 50 which failed and temazepam and zolpidem which caused amnesia    Review of Systems:  Review of Systems  Gastrointestinal: Positive for diarrhea. Negative for abdominal distention, abdominal pain, constipation and  nausea.       Heartburn resolved  Neurological: Negative for tremors and weakness.  Psychiatric/Behavioral: Positive for dysphoric mood. Negative for agitation, behavioral problems, confusion, decreased concentration, hallucinations, self-injury, sleep disturbance and suicidal ideas. The patient is nervous/anxious. The patient is not hyperactive.     Medications: I have reviewed the patient's current medications.  Current Outpatient Medications  Medication Sig Dispense Refill  . propranolol ER (INDERAL LA) 120 MG 24 hr capsule TK 1 C PO D    . sertraline (ZOLOFT) 100 MG tablet 1 in the morning and 2 at night 270 tablet 0  . brexpiprazole (REXULTI) 2 MG TABS tablet Take 1 tablet (2 mg total) by mouth daily. (Patient not taking: Reported on 02/09/2019) 30 tablet 1   No current facility-administered medications for this visit.     Medication Side Effects: Other: GI issues.  Miralax helped. diarrhea  Allergies:  Allergies  Allergen Reactions  . Erythromycin     Itching and hives     Past Medical History:  Diagnosis Date  . Anxiety   . Depression   . OCD (obsessive compulsive disorder) 1998    Family History  Problem Relation Age of Onset  . Heart disease Father   . Hyperlipidemia Brother     Social History   Socioeconomic History  . Marital status: Married    Spouse name: Mark Estrada  . Number of children: 1  . Years of education: Master's  . Highest education level: Not on file  Occupational History  . Occupation: History Teacher    Comment: Psychiatrist  Social Needs  . Financial resource strain: Not on file  . Food insecurity    Worry: Not on file    Inability: Not on file  . Transportation needs    Medical: Not on file    Non-medical: Not on file  Tobacco Use  . Smoking status: Never Smoker  . Smokeless tobacco: Never Used  Substance and Sexual Activity  . Alcohol use: No  . Drug use: No  . Sexual activity: Not on file  Lifestyle  . Physical  activity    Days per week: Not on file    Minutes per session: Not on file  . Stress: Not on file  Relationships  . Social Musician on phone: Not on file    Gets together: Not on file    Attends religious service: Not on file    Active member of club or organization: Not on file    Attends meetings of clubs or organizations: Not on file    Relationship status: Not on file  . Intimate partner violence    Fear of current or ex partner: Not on file    Emotionally abused: Not on file    Physically abused: Not on file    Forced sexual activity: Not on file  Other Topics Concern  . Not on file  Social History Narrative   Lives with his wife and their daughter.    Past Medical History, Surgical history, Social history, and Family history were reviewed and updated as appropriate.   Please see review of systems for further details on the patient's review from today.   Objective:   Physical Exam:  There were no vitals taken for this visit.  Physical Exam Constitutional:      General: He is not in acute distress.    Appearance: He is well-developed.  Musculoskeletal:        General: No deformity.  Neurological:     Mental Status: He is alert and oriented to person, place, and time.     Cranial Nerves: No dysarthria.     Motor: No tremor.     Coordination: Coordination normal.  Psychiatric:        Attention and Perception: Attention and perception normal. He does not perceive auditory or visual hallucinations.        Mood and Affect: Mood is anxious. Mood is not depressed. Affect is blunt. Affect is not labile, angry or inappropriate.        Speech: Speech normal.        Behavior: Behavior normal. Behavior is cooperative.        Thought Content: Thought content is not paranoid or delusional. Thought content does not include homicidal or suicidal ideation. Thought content does not include homicidal or suicidal plan.        Cognition and Memory: Cognition and memory  normal.        Judgment: Judgment normal.     Comments: OCD is noted.  His affect is less anxious but more blunted than at the last visit.     Lab Review:     Component Value Date/Time   NA 142 07/26/2017 1534   K 4.6 07/26/2017 1534   CL 100 07/26/2017 1534   CO2 22 07/26/2017 1534   GLUCOSE 109 (H) 07/26/2017 1534   GLUCOSE 99 12/11/2014 1119   BUN 11 07/26/2017 1534   CREATININE 1.02 07/26/2017 1534   CREATININE 0.99 12/11/2014 1119   CALCIUM 10.4 (H) 07/26/2017 1534   PROT 8.4 07/26/2017 1534   ALBUMIN 5.0 (H) 07/26/2017 1534   AST 31 07/26/2017 1534   ALT 31 07/26/2017 1534   ALKPHOS 78 07/26/2017 1534   BILITOT 0.8 07/26/2017 1534   GFRNONAA 80 07/26/2017 1534   GFRNONAA 84 12/11/2014 1119   GFRAA 92 07/26/2017 1534   GFRAA >89 12/11/2014 1119       Component Value Date/Time   WBC 10.4 07/26/2017 1534   WBC 9.9 12/11/2014 1130   RBC 5.61 07/26/2017 1534   RBC 5.69 12/11/2014 1130   HGB 18.0 (H) 07/26/2017 1534   HCT 51.8 (H) 07/26/2017 1534   PLT 296 07/26/2017 1534   MCV 92 07/26/2017 1534   MCH 32.1 07/26/2017 1534   MCH 29.3 12/11/2014 1130   MCHC 34.7 07/26/2017 1534   MCHC 31.7 (A) 12/11/2014 1130   RDW 13.7 07/26/2017 1534   LYMPHSABS 3.1 07/26/2017 1534   EOSABS 0.4 07/26/2017 1534   BASOSABS 0.0 07/26/2017 1534    No results found for: POCLITH, LITHIUM   No results found for: PHENYTOIN, PHENOBARB, VALPROATE, CBMZ   .res Assessment: Plan:    Mixed obsessional thoughts and acts  Major depressive disorder, recurrent episode, moderate (HCC)  Insomnia due to mental condition  Rule out excessive sedation fatigue from medication  Greater than 50% of 30 min face to  face time with patient was spent on counseling and coordination of care. We discussed the following.  Agree with seeking 2 nd opinion for TR OCD and depression.  Discussed options.  Also he can bring his wife if he thinks that will help.  Consider lamotrigine, lithium, Vraylar,  Rexulti.  Consider ECT.  Mark Estrada got better very quickly with regard to the OCD and anxiety and resolution of suicidal thoughts after starting sertraline and Abilify.  He had severe symptoms of OCD and depression set off by his retirement.   However he is bothered now by continued general worry and some obsessing especially about money but no significant checking.  He is also bothered by low motivation and low interest and mild cognitive problems.  With the input from his wife it is clear that he is severely depressed and he reports that the depression is worse off the Abilify and nothing is better. There is also question whether there may be an underlying bipolar predisposition because of his manic symptoms that he had around December 2018 which were quite dramatic according to his wife.  However it was at a time when he had abruptly discontinued paroxetine.  Overall it appears to me that he has had improvement in the OCD but has general worry and the other problems noted above.  Discussed options.  It would be very risky to change the sertraline especially given he is tried all the major alternatives except fluoxetine which does not tend to be as effective.   Discussed the study from several years ago in which Zoloft was studied for OCD up to 400 mg daily Soo showing a dose-dependent improvement as the dosage went higher.  It was also well tolerated.  We have discussed serotonin syndrome and is aware of that risk.  He wants to pursue the higher dose in order to better control his OCD.  Offered to give him a copy of this article.  Reduce sertraline 250 mg daily.  Due to diarrhea discussed the higher than usual dosage and studies that support this.  It has helped the OCD but still a lot of general worry.   Vraylar 1.5 mg samples 1 daily.  Try to stay busy bc lack of activity worsens his OCD.  Disc course progression of OCD and length of therapy required to achieve response.  Treatment of OCD typically  requires along trials with serotonin medications.   Discussed potential metabolic side effects associated with atypical antipsychotics, as well as potential risk for movement side effects. Advised pt to contact office if movement side effects occur.  There is none evident at this time.  Restart the  Lorazepam 2 HS which worked for lseep before.  Continue those as you are now. We discussed the short-term risks associated with benzodiazepines including sedation and increased fall risk among others.  Discussed long-term side effect risk including dependence, potential withdrawal symptoms, and the potential eventual dose-related risk of dementia.  Contact us with any unusual side effects or if symptoms worsen significantly off of the Abilify.  We discussed that risk.  Discussed the importance of not letting his symptoms drag on for months at a time if he is not doing well.  He is not been seen since April which is not good given that he is not well. \ His wife is encouraging to have counseling which is never done before.  He agrees.  Will refer to Dr. Raynelle Jan for his OCD and depression  Follow-up 2-3 weeks due to severity of  symptoms  Meredith Staggersarey Cottle MD, DFAPA  Please see After Visit Summary for patient specific instructions.  No future appointments.  No orders of the defined types were placed in this encounter.     -------------------------------

## 2019-02-09 NOTE — Patient Instructions (Signed)
Vraylar 1.5 mg capsule 1 daily  Reduce sertraline to 2 1/2 tablets daily

## 2019-02-26 ENCOUNTER — Other Ambulatory Visit: Payer: Self-pay

## 2019-02-26 ENCOUNTER — Ambulatory Visit (INDEPENDENT_AMBULATORY_CARE_PROVIDER_SITE_OTHER): Payer: BC Managed Care – PPO | Admitting: Psychiatry

## 2019-02-26 ENCOUNTER — Encounter: Payer: Self-pay | Admitting: Psychiatry

## 2019-02-26 DIAGNOSIS — F422 Mixed obsessional thoughts and acts: Secondary | ICD-10-CM | POA: Diagnosis not present

## 2019-02-26 DIAGNOSIS — F332 Major depressive disorder, recurrent severe without psychotic features: Secondary | ICD-10-CM

## 2019-02-26 DIAGNOSIS — F5105 Insomnia due to other mental disorder: Secondary | ICD-10-CM

## 2019-02-26 NOTE — Patient Instructions (Signed)
Continue Vraylar 1.5 mg daily without change until it reaches maximum blood levels in early January  Reduce sertraline to 2 daily to see if energy, motivation is better

## 2019-02-26 NOTE — Progress Notes (Signed)
Mark GirtLawrence Collinsworth [id number] 03/28/1955 63 y.o.  Subjective:   Patient ID:  Mark Estrada is a 63 y.o. (DOB 05/05/1955) male.  Chief Complaint:  Chief Complaint  Patient presents with  . Follow-up    Medication management and changes  . Anxiety    Medication management and OCD  . Depression    Medication management    HPI: Mark Estrada presents to the office today for follow-up of unmanaged depression and OCD.  He has been fairly unstable for several months.  And at visit May 21, 2018 we increased sertraline from 150 to 200 mg a day  And then 250 about a month ago.  wean off the clomipramine since it did not seem necessary to continue both.  in April when it was recommended he increase sertraline to 300 mg and Abilify to 10 mg daily.  When seen September 2020.  The following adjustments were made: Continue sertraline  300 mg (1 in AM and 2 HS).  Discussed the higher than usual dosage and studies that support this.  It has helped the OCD but still a lot of general worry. Stop Abilify to see if it's causing fatigue. If depression or anxiety worsens, then start samples of Rexulti 1 mg for 1 week, then 2 mg daily.  He never took Rexulti.    W wants him to get a 2nd opinion.  Disc this.  Her concern is he is not himself, gloomy and low energy and motivation.  At his appointment in September he was encouraged again with his wife's presents to get a second opinion about his treatment.  Last seen February 09, 2019. Energy no better off Abilify and gradually depression and anxiety got worse.  Mind racing at night interferes with sleep and gets 2-3 hours of sleep.  Repeated awakenings.  Continued obsessions and depression. Since the increase in sertraline to 300 mg had not been additionally helpful it was reduced to 250 mg daily.  He was started off label Vraylar 1.5 mg daily because of some mood benefit from the Abilify but insufficient effects.  I think I'm a little  better.  I just feel better.  If could get a better job fit he would feel even better.    Energy might be better with reduction in Zoloft and adding Vraylar.  No SE noted.  Disappointed in myself for not doing something.  Not me.  Applied for [employer] job.    Wife sees high anxiety without changes with Abilify.  She notes he's quieter and will ask her for reassurance stated again lately but not as bad as in the past.   He's less motivated and interested and active overall. W concerned he's not improving at all and making no effort.  He's getting weaker.  No recent Tremor.  No change in thinking is not clear and trouble with decisions.   Not bathing or brushing his teeth.  Will go a week without a shower.  Noticing lying or nothing.  Not normal for him.  No friends, hobbies or activity unless forced.    Last couple of years periods of mania with excessive spending and hyperverbal speech.  He never spends money.  This happened when he retired.  Talked pressured and lasted a month or more.  She thinks it happened 2 years ago after  [place] stopping some meds.  Addnl stress Covid affecting income and therefore more idle as well which doesn't help.  3 online classes being taught. Wife lost her job.  No sig change in the following sx except as noted.  Pt reports that mood is anhedonia,  Anxious, Depressed, Irritable, Worthless and helpless and describes anxiety as Moderate. Depression is milder.  Anxiety symptoms include: Excessive Worry, Obsessive Compulsive Symptoms:   without checking but obsessing over not enough money, worry. Pt reports no sleep issues and 7 hours with Ativan 2 mg. Pt reports that appetite is good. Pt reports that energy is poor and anhedonia, loss of interest or pleasure in usual activities, poor motivation and withdrawn from usual activities. Concentration is poor. Suicidal thoughts:  denied by patient and occ death thoughts. Also some cognitive problems with mistakes and  oversights that are unusual for him.  Earlier this year started obsessing on stool but that is better but not gone.  All I can think about bc I think i've gotten something serious.  Loose stool.  Only 1 BM daily unless stressed then 2 daily.  Had colonoscopy and CT scan normal.    Other psych med trials include: Clomipramine helped the depression but not the OCD,  Paroxetine 80,  Sertraline 300  Sertraline started early December 2019 Fluvoxamine (both of which eventually failed even at high dosage)  Olanzapine,  Quetiapine,  Aripiprazole,  Risperidone 1 mg Dec 2018 SE loopy  Never took Rexulti Vraylar 1.5 mg daily for sleep trazodone 50 which failed and temazepam and zolpidem which caused amnesia   Review of Systems:  Review of Systems  Gastrointestinal: Positive for diarrhea. Negative for abdominal distention, abdominal pain, constipation and nausea.       Heartburn resolved  Neurological: Negative for tremors and weakness.  Psychiatric/Behavioral: Positive for dysphoric mood. Negative for agitation, behavioral problems, confusion, decreased concentration, hallucinations, self-injury, sleep disturbance and suicidal ideas. The patient is nervous/anxious. The patient is not hyperactive.     Medications: I have reviewed the patient's current medications.  Current Outpatient Medications  Medication Sig Dispense Refill  . cariprazine (VRAYLAR) capsule Take 1.5 mg by mouth daily.    Marland Kitchen LORazepam (ATIVAN) 1 MG tablet Take 2 tablets (2 mg total) by mouth at bedtime. 60 tablet 1  . propranolol ER (INDERAL LA) 120 MG 24 hr capsule TK 1 C PO D    . sertraline (ZOLOFT) 100 MG tablet 1 in the morning and 2 at night 270 tablet 0   No current facility-administered medications for this visit.     Medication Side Effects: Other: GI issues.  Miralax helped. diarrhea  Allergies:  Allergies  Allergen Reactions  . Erythromycin     Itching and hives     Past Medical History:  Diagnosis Date  .  Anxiety   . Depression   . OCD (obsessive compulsive disorder) 1998    Family History  Problem Relation Age of Onset  . Heart disease Father   . Hyperlipidemia Brother     Social History   Socioeconomic History  . Marital status: Married    Spouse name: [name]  . Number of children: 1  . Years of education: Master's  . Highest education level: Not on file  Occupational History  . Occupation: History Teacher    Comment: Psychiatrist  Social Needs  . Financial resource strain: Not on file  . Food insecurity    Worry: Not on file    Inability: Not on file  . Transportation needs    Medical: Not on file    Non-medical: Not on file  Tobacco Use  . Smoking status: Never Smoker  .  Smokeless tobacco: Never Used  Substance and Sexual Activity  . Alcohol use: No  . Drug use: No  . Sexual activity: Not on file  Lifestyle  . Physical activity    Days per week: Not on file    Minutes per session: Not on file  . Stress: Not on file  Relationships  . Social Musician on phone: Not on file    Gets together: Not on file    Attends religious service: Not on file    Active member of club or organization: Not on file    Attends meetings of clubs or organizations: Not on file    Relationship status: Not on file  . Intimate partner violence    Fear of current or ex partner: Not on file    Emotionally abused: Not on file    Physically abused: Not on file    Forced sexual activity: Not on file  Other Topics Concern  . Not on file  Social History Narrative   Lives with his wife and their daughter.    Past Medical History, Surgical history, Social history, and Family history were reviewed and updated as appropriate.   Please see review of systems for further details on the patient's review from today.   Objective:   Physical Exam:  There were no vitals taken for this visit.  Physical Exam Constitutional:      General: He is not in acute distress.     Appearance: He is well-developed.  Musculoskeletal:        General: No deformity.  Neurological:     Mental Status: He is alert and oriented to person, place, and time.     Cranial Nerves: No dysarthria.     Motor: No tremor.     Coordination: Coordination normal.  Psychiatric:        Attention and Perception: Attention and perception normal. He does not perceive auditory or visual hallucinations.        Mood and Affect: Mood is anxious. Mood is not depressed. Affect is blunt. Affect is not labile, angry or inappropriate.        Speech: Speech normal. Speech is not slurred.        Behavior: Behavior normal. Behavior is cooperative.        Thought Content: Thought content is not paranoid or delusional. Thought content does not include homicidal or suicidal ideation. Thought content does not include homicidal or suicidal plan.        Cognition and Memory: Cognition and memory normal.        Judgment: Judgment normal.     Comments: OCD is noted.  His affect is less anxious but more blunted than at the last visit.     Lab Review:     Component Value Date/Time   NA 142 07/26/2017 1534   K 4.6 07/26/2017 1534   CL 100 07/26/2017 1534   CO2 22 07/26/2017 1534   GLUCOSE 109 (H) 07/26/2017 1534   GLUCOSE 99 12/11/2014 1119   BUN 11 07/26/2017 1534   CREATININE 1.02 07/26/2017 1534   CREATININE 0.99 12/11/2014 1119   CALCIUM 10.4 (H) 07/26/2017 1534   PROT 8.4 07/26/2017 1534   ALBUMIN 5.0 (H) 07/26/2017 1534   AST 31 07/26/2017 1534   ALT 31 07/26/2017 1534   ALKPHOS 78 07/26/2017 1534   BILITOT 0.8 07/26/2017 1534   GFRNONAA 80 07/26/2017 1534   GFRNONAA 84 12/11/2014 1119   GFRAA 92 07/26/2017 1534  GFRAA >89 12/11/2014 1119       Component Value Date/Time   WBC 10.4 07/26/2017 1534   WBC 9.9 12/11/2014 1130   RBC 5.61 07/26/2017 1534   RBC 5.69 12/11/2014 1130   HGB 18.0 (H) 07/26/2017 1534   HCT 51.8 (H) 07/26/2017 1534   PLT 296 07/26/2017 1534   MCV 92 07/26/2017  1534   MCH 32.1 07/26/2017 1534   MCH 29.3 12/11/2014 1130   MCHC 34.7 07/26/2017 1534   MCHC 31.7 (A) 12/11/2014 1130   RDW 13.7 07/26/2017 1534   LYMPHSABS 3.1 07/26/2017 1534   EOSABS 0.4 07/26/2017 1534   BASOSABS 0.0 07/26/2017 1534    No results found for: POCLITH, LITHIUM   No results found for: PHENYTOIN, PHENOBARB, VALPROATE, CBMZ   .res Assessment: Plan:    Mixed obsessional thoughts and acts  Severe episode of recurrent major depressive disorder, without psychotic features (HCC)  Insomnia due to mental condition  Rule out excessive sedation fatigue from medication  Greater than 50% of 30 min face to face time with patient was spent on counseling and coordination of care. We discussed the following.  Agree with seeking 2 nd opinion for TR OCD and depression.  Discussed options.  Also he can bring his wife if he thinks that will help.  Consider lamotrigine, lithium, Vraylar, Rexulti.  Consider ECT.  Peyton Najjar got better very quickly with regard to the OCD and anxiety and resolution of suicidal thoughts after starting sertraline and Abilify initially.  He had severe symptoms of OCD and depression set off by his retirement.   However he is bothered now by continued general worry and some obsessing especially about money but no significant checking.  He is also bothered by low motivation and low interest and mild cognitive problems.  With the input from his wife it is clear that he is severely depressed and he reports that the depression is worse off the Abilify and nothing is better.  There is also question whether there may be an underlying bipolar predisposition because of his manic symptoms that he had around December 2018 which were quite dramatic according to his wife.  However it was at a time when he had abruptly discontinued paroxetine.  Overall it appears to me that he has had improvement in the OCD but has general worry and the other problems noted above.  Discussed  options.  It would be very risky to change the sertraline especially given he is tried all the major alternatives except fluoxetine which does not tend to be as effective.   Discussed the study from several years ago in which Zoloft was studied for OCD up to 400 mg daily Soo showing a dose-dependent improvement as the dosage went higher.  It was also well tolerated.  We have discussed serotonin syndrome and is aware of that risk.  He wants to pursue the higher dose in order to better control his OCD.  Offered to give him a copy of this article.  Reduce sertraline 200 mg daily.  Due to diarrhea and tiredness, to see if motivation is better and no apparent additional benefit at the higher dosages up to 300 mg daily.  It has helped the OCD but still a lot of general worry.  Continue Vraylar 1.5 mg samples 1 daily.  Consider switch to fluoxetine as the only other FDA approved OCD med he's not taken. 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  Disc course progression of OCD and length of therapy required to achieve response.  Treatment of OCD typically requires along trials with serotonin medications.   Discussed potential metabolic side effects associated with atypical antipsychotics, as well as potential risk for movement side effects. Advised pt to contact office if movement side effects occur.  There is none evident at this time.  Continue the  Lorazepam 2 HS which worked for lseep before.  Continue those as you are now. We discussed the short-term risks associated with benzodiazepines including sedation and increased fall risk among others.  Discussed long-term side effect risk including dependence, potential withdrawal symptoms, and the potential eventual dose-related risk of dementia.  Contact us with any unusual side effects or if symptoms worsen significantly off of the Abilify.  We discussed that risk.  Discussed the importance of not  letting his symptoms drag on for months at a time if he is not doing well.  He is not been seen since April which is not good given that he is not well.  His wife is encouraging to have counseling which is never done before.  He agrees.  Will refer to Dr. Raynelle Jan for his OCD and depression  Follow-up 4 weeks due to reaching max blood level of Vraylar  Lynder Parents MD, DFAPA  Please see After Visit Summary for patient specific instructions.  Future Appointments  Date Time Provider [place]  03/06/2019 11:00 AM Blanchie Serve, PhD CP-CP None    No orders of the defined types were placed in this encounter.     -------------------------------

## 2019-03-06 ENCOUNTER — Ambulatory Visit (INDEPENDENT_AMBULATORY_CARE_PROVIDER_SITE_OTHER): Payer: BC Managed Care – PPO | Admitting: Psychiatry

## 2019-03-06 ENCOUNTER — Other Ambulatory Visit: Payer: Self-pay

## 2019-03-06 DIAGNOSIS — F422 Mixed obsessional thoughts and acts: Secondary | ICD-10-CM | POA: Diagnosis not present

## 2019-03-06 DIAGNOSIS — F330 Major depressive disorder, recurrent, mild: Secondary | ICD-10-CM | POA: Diagnosis not present

## 2019-03-06 NOTE — Progress Notes (Signed)
PROBLEM-FOCUSED INITIAL PSYCHOTHERAPY EVALUATION [name], PhD LP [facility]  Name: Mark Estrada Date: 03/06/2019 Time spent: 55 min MRN: [id number] DOB: [birth year] Guardian/Payee: self  PCP: Ethelda Chick, MD Documentation requested on this visit: No  PROBLEM HISTORY Reason for Visit /Presenting Problem:  Chief Complaint  Patient presents with  . Establish Care  . Anxiety    Narrative/History of Present Illness Referred by Dr. Jennelle Human for treatment of OCD.  "It changes."  Originally noticed in high school, then it subsided some, 1997 came back with a fury after finding a lump under his arm, which was benign but lit up his imagination.  Turned to plumbing worries (leaks, catastrophic), which can still flare up.  Theme of things that will cost too much.  Currently, worried about Mark Estrada's job loss, and his own.  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  Basically secure with savings and proceeds from a land sale, but doesn't fully relax with it.  Can still worry and compare.  Brother and sister doing well financially, can be envious, but not in a way that causes distress.   Mainly, wants more control over anxiety.  Hx brief exposure to a body-focused meditation effort that didn't really take.  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 Had one panic attack late 80s, in subway in [place].  These days will get flushed, jittery in response to news like 63yo D Fleet Contras 's overspending and the sudden flash of catastrophic thinking.    Prior Psychiatric Assessment/Treatment:   Outpatient treatment: med management with Dr. Jennelle Human, unnamed therapy attempts Psychiatric hospitalization: none stated Psychological assessment/testing: none stated   Abuse History: Victim of abuse: Not assessed at this time / none suspected.   Victim of neglect: Not  assessed at this time / none suspected.   Perpetrator of abuse/neglect: Not assessed at this time / none suspected.   Witness / Exposure to Domestic Violence: Not assessed at this time / none suspected.   Witness to Community Violence:  Not assessed at this time / none suspected.   Protective Services Involvement: No.   Report needed: No.    Substance Abuse History: Current substance abuse: Not assessed at this time / none suspected.   History of impactful substance use/abuse: Not assessed at this time / none suspected.     FAMILY/SOCIAL HISTORY Family of origin -- deferred Family of intention/current living situation -- married, remainder deferred Education -- Nurse, learning disability -- Advice worker -- Current income from partial employment, some concerns Spiritually -- deferred Enjoyable activities -- deferred Other situational factors affecting treatment and prognosis: Stressors from the following areas: Financial difficulties Barriers to service: none stated  Notable cultural sensitivities: none stated Strengths: Supportive Relationships and Family   MED/SURG HISTORY Med/surg history was not reviewed with PT at this time.   Past Medical History:  Diagnosis Date  . Anxiety   . Depression   . OCD (obsessive compulsive disorder) 1998     Past Surgical History:  Procedure Laterality Date  . APPENDECTOMY    . TONSILLECTOMY AND ADENOIDECTOMY      Allergies  Allergen Reactions  . Erythromycin     Itching and hives     Medications (as listed in Epic): Current Outpatient Medications  Medication Sig Dispense Refill  . cariprazine (VRAYLAR) capsule Take 1.5 mg by mouth daily.    Marland Kitchen LORazepam (  ATIVAN) 1 MG tablet Take 2 tablets (2 mg total) by mouth at bedtime. 60 tablet 1  . propranolol ER (INDERAL LA) 120 MG 24 hr capsule TK 1 C PO D    . sertraline (ZOLOFT) 100 MG tablet 1 in the morning and 2 at night (Patient taking differently: 2 1/2 tablets daily) 270 tablet 0    No current facility-administered medications for this visit.    MENTAL STATUS AND OBSERVATIONS Appearance:   Casual     Behavior:  Appropriate  Motor:  Normal  Speech/Language:   Clear and Coherent  Affect:  Appropriate and Restricted  Mood:  worrisome, subdued  Thought process:  normal  Thought content:    worries  Sensory/Perceptual disturbances:    WNL  Orientation:  Fully oriented  Attention:  Good  Concentration:  Good  Memory:  WNL  Fund of knowledge:   Good  Insight:    Good  Judgment:   Good  Impulse Control:  Good  Initial Risk Assessment: Danger to Self: No Self-injurious Behavior: No Danger to Others: No Physical Aggression / Violence: No Duty to Warn: No Access to Firearms a concern: No Gang Involvement: No Patient / guardian was educated about steps to take if suicide or homicide risk level increases between visits: yes . While future psychiatric events cannot be accurately predicted, the patient does not currently require acute inpatient psychiatric care and does not currently meet [facility] involuntary commitment criteria.   DIAGNOSIS:  No diagnosis found.  INITIAL TREATMENT: . Ethical orientation and informed consent re: o privacy rights -- including but not limited to HIPAA, EMR and use of e-PHI o patient responsibilities -- scheduling, fair notice of changes, in-person vs. telehealth and regulatory and financial conditions affecting choice o expectations for working relationship in psychotherapy o expectations and consents for working partnerships with other health care disciplines, especially including medication and other behavioral health providers . Support/validation for distressing symptoms and confirmed rapport . Outlined general strategy for CBT of obsessive-compulsive symptoms and probed self-soothing skills.  . Oriented to journaling for stress management and conducted exercise in 2-column technique for answering worry (ex. D's spending,  credit card bill) . Oriented to brief relaxer . Outlook for therapy -- scheduling constraints, availability of crisis service, inclusion of family member(s) as appropriate  Plan: . Practice brief relaxer ad lib . Journal to make self-observations about triggers and thought process with OC and depressive concerns . Maintain medication as prescribed and work faithfully with relevant prescriber(s) if any changes are desired or seem indicated . Call the clinic on-call service, present to ER, or call 911 if any life-threatening psychiatric crisis Return in about 3 weeks (around 03/27/2019) for recommend scheduling ahead q 2 wks for 3 sessions if possible.  Blanchie Serve, PhD  Luan Moore, PhD LP Clinical Psychologist, [facility] [address] [place], [place] [address] [phone]

## 2019-04-01 ENCOUNTER — Other Ambulatory Visit: Payer: Self-pay

## 2019-04-01 ENCOUNTER — Encounter: Payer: Self-pay | Admitting: Psychiatry

## 2019-04-01 ENCOUNTER — Ambulatory Visit (INDEPENDENT_AMBULATORY_CARE_PROVIDER_SITE_OTHER): Payer: BC Managed Care – PPO | Admitting: Psychiatry

## 2019-04-01 DIAGNOSIS — F422 Mixed obsessional thoughts and acts: Secondary | ICD-10-CM | POA: Diagnosis not present

## 2019-04-01 DIAGNOSIS — F5105 Insomnia due to other mental disorder: Secondary | ICD-10-CM | POA: Diagnosis not present

## 2019-04-01 DIAGNOSIS — F330 Major depressive disorder, recurrent, mild: Secondary | ICD-10-CM | POA: Diagnosis not present

## 2019-04-01 DIAGNOSIS — F332 Major depressive disorder, recurrent severe without psychotic features: Secondary | ICD-10-CM

## 2019-04-01 MED ORDER — LORAZEPAM 1 MG PO TABS: 2.0000 mg | ORAL_TABLET | Freq: Every day | ORAL | 1 refills | Status: DC

## 2019-04-01 MED ORDER — CARIPRAZINE HCL 1.5 MG PO CAPS: 1.5000 mg | ORAL_CAPSULE | Freq: Every day | ORAL | 1 refills | Status: DC

## 2019-04-01 MED ORDER — SERTRALINE HCL 100 MG PO TABS: 200.0000 mg | ORAL_TABLET | Freq: Every day | ORAL | 0 refills | Status: DC

## 2019-04-01 NOTE — Progress Notes (Signed)
Mark Estrada [id number] [birth year] 64 y.o.  Subjective:   Patient ID:  Mark Estrada is a 64 y.o. (DOB March 17, 1956) male.  Chief Complaint:  Chief Complaint  Patient presents with  . Follow-up    Medication Managament  . Anxiety    Medication Managament and OCD  . Depression    Medication Managament    HPI: Mark Estrada presents to the office today for follow-up of unmanaged depression and OCD.  He has been fairly unstable for several months.  And at visit May 21, 2018 we increased sertraline from 150 to 200 mg a day  And then 250 about a month ago.  wean off the clomipramine since it did not seem necessary to continue both.  in April when it was recommended he increase sertraline to 300 mg and Abilify to 10 mg daily.  When seen September 2020.  The following adjustments were made: Continue sertraline  300 mg (1 in AM and 2 HS).  Discussed the higher than usual dosage and studies that support this.  It has helped the OCD but still a lot of general worry. Stop Abilify to see if it's causing fatigue. If depression or anxiety worsens, then start samples of Rexulti 1 mg for 1 week, then 2 mg daily.  He never took Rexulti.    W wants him to get a 2nd opinion.  Disc this.  Her concern is he is not himself, gloomy and low energy and motivation.  At his appointment in September he was encouraged again with his wife's presents to get a second opinion about his treatment.  seen February 09, 2019. Energy no better off Abilify and gradually depression and anxiety got worse.  Mind racing at night interferes with sleep and gets 2-3 hours of sleep.  Repeated awakenings.  Continued obsessions and depression. Since the increase in sertraline to 300 mg had not been additionally helpful it was reduced to 250 mg daily.  He was started off label Vraylar 1.5 mg daily because of some mood benefit from the Abilify but insufficient effects.  Last seen Feb 26, 2019.  Sertraline was  reduced to 200 mg daily due to diarrhea and tiredness and to see if motivation was improved and because of no apparent additional benefit at 300 mg daily.  He was continued on Vraylar 1.5 mg daily which he's now been on almost 6 weeks and lorazepam 2 mg nightly .  A little more energy with the Vraylar.  Mild improvement in home activity level.  Wife hasn't said if she notices.  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  Hopes to do it the fullly allowed 3 1/2 days/week.  Loose  BM , no cramps once daily.  Sleep usually ok with lorazepam.  Better with hygiene.  Last couple of years periods of mania with excessive spending and hyperverbal speech.  He never spends money.  This happened when he retired.  Talked pressured and lasted a month or more.  She thinks it happened 2 years ago after  [place] stopping some meds.  Addnl stress Covid affecting income and therefore more idle as well which doesn't help.  3 online classes being taught. Wife lost her job.     No sig change in the following sx except as noted.  Pt reports that mood is anhedonia,  Anxious, Depressed, Irritable, Worthless and helpless and describes anxiety as Moderate. Depression is milder.  Anxiety symptoms include: Excessive Worry, Obsessive Compulsive Symptoms:   without checking but  obsessing over not enough money, worry. Constantly worries over job and money frustrating no response to job applications.  Pt reports no sleep issues and 7 hours with Ativan 2 mg. Pt reports that appetite is good. Pt reports that energy is poor and anhedonia, loss of interest or pleasure in usual activities, poor motivation and withdrawn from usual activities. Concentration is poor. Suicidal thoughts:  denied by patient and occ death thoughts. Also some cognitive problems with mistakes and oversights that are unusual for him.  Less obsessing on health and more on money/job.  Other psych med trials include: Clomipramine helped the depression but not the  OCD,  Paroxetine 80,  Sertraline 300  Sertraline started early December 2019 Fluvoxamine (both of which eventually failed even at high dosage)  Olanzapine,  Quetiapine,  Aripiprazole,  Risperidone 1 mg Dec 2018 [place]  Never took Rexulti Vraylar 1.5 mg daily for sleep trazodone 50 which failed and temazepam and zolpidem which caused amnesia    Review of Systems:  Review of Systems  Gastrointestinal: Positive for diarrhea. Negative for abdominal distention, abdominal pain, constipation and nausea.       Heartburn resolved  Neurological: Negative for tremors and weakness.  Psychiatric/Behavioral: Positive for dysphoric mood. Negative for agitation, behavioral problems, confusion, decreased concentration, hallucinations, self-injury, sleep disturbance and suicidal ideas. The patient is nervous/anxious. The patient is not hyperactive.     Medications: I have reviewed the patient's current medications.  Current Outpatient Medications  Medication Sig Dispense Refill  . cariprazine (VRAYLAR) capsule Take 1 capsule (1.5 mg total) by mouth daily. 30 capsule 1  . LORazepam (ATIVAN) 1 MG tablet Take 2 tablets (2 mg total) by mouth at bedtime. 60 tablet 1  . propranolol ER (INDERAL LA) 120 MG 24 hr capsule TK 1 C PO D    . sertraline (ZOLOFT) 100 MG tablet Take 2 tablets (200 mg total) by mouth daily. 180 tablet 0   No current facility-administered medications for this visit.    Medication Side Effects: Other: GI issues.  Miralax helped. diarrhea  Allergies:  Allergies  Allergen Reactions  . Erythromycin     Itching and hives     Past Medical History:  Diagnosis Date  . Anxiety   . Depression   . OCD (obsessive compulsive disorder) 1998    Family History  Problem Relation Age of Onset  . Heart disease Father   . Hyperlipidemia Brother     Social History   Socioeconomic History  . Marital status: Married    Spouse name: [name]  . Number of children: 1  . Years of  education: Master's  . Highest education level: Not on file  Occupational History  . Occupation: History Teacher    Comment: [employer]  Tobacco Use  . Smoking status: Never Smoker  . Smokeless tobacco: Never Used  Substance and Sexual Activity  . Alcohol use: No  . Drug use: No  . Sexual activity: Not on file  Other Topics Concern  . Not on file  Social History Narrative   Lives with his wife and their daughter.   Social Determinants of Health   Financial Resource Strain:   . Difficulty of Paying Living Expenses: Not on file  Food Insecurity:   . Worried About Programme researcher, broadcasting/film/videounning Out of Food in the Last Year: Not on file  . Ran Out of Food in the Last Year: Not on file  Transportation Needs:   . Lack of Transportation (Medical): Not on file  .  Lack of Transportation (Non-Medical): Not on file  Physical Activity:   . Days of Exercise per Week: Not on file  . Minutes of Exercise per Session: Not on file  Stress:   . Feeling of Stress : Not on file  Social Connections:   . Frequency of Communication with Friends and Family: Not on file  . Frequency of Social Gatherings with Friends and Family: Not on file  . Attends Religious Services: Not on file  . Active Member of Clubs or Organizations: Not on file  . Attends Banker Meetings: Not on file  . Marital Status: Not on file  Intimate Partner Violence:   . Fear of Current or Ex-Partner: Not on file  . Emotionally Abused: Not on file  . Physically Abused: Not on file  . Sexually Abused: Not on file    Past Medical History, Surgical history, Social history, and Family history were reviewed and updated as appropriate.   Please see review of systems for further details on the patient's review from today.   Objective:   Physical Exam:  There were no vitals taken for this visit.  Physical Exam Constitutional:      General: He is not in acute distress.    Appearance: He is well-developed.  Musculoskeletal:         General: No deformity.  Neurological:     Mental Status: He is alert and oriented to person, place, and time.     Cranial Nerves: No dysarthria.     Motor: No tremor.     Coordination: Coordination normal.  Psychiatric:        Attention and Perception: Attention and perception normal. He does not perceive auditory or visual hallucinations.        Mood and Affect: Mood is anxious. Mood is not depressed. Affect is blunt. Affect is not labile, angry or inappropriate.        Speech: Speech normal. Speech is not slurred.        Behavior: Behavior normal. Behavior is cooperative.        Thought Content: Thought content is not paranoid or delusional. Thought content does not include homicidal or suicidal ideation. Thought content does not include homicidal or suicidal plan.        Cognition and Memory: Cognition and memory normal.        Judgment: Judgment normal.     Comments: OCD is noted.  His affect is less anxious but still blunted.  Not much objective change since reduction in sertraline. Subjectively less  Depressed than anxious     Lab Review:     Component Value Date/Time   NA 142 07/26/2017 1534   K 4.6 07/26/2017 1534   CL 100 07/26/2017 1534   CO2 22 07/26/2017 1534   GLUCOSE 109 (H) 07/26/2017 1534   GLUCOSE 99 12/11/2014 1119   BUN 11 07/26/2017 1534   CREATININE 1.02 07/26/2017 1534   CREATININE 0.99 12/11/2014 1119   CALCIUM 10.4 (H) 07/26/2017 1534   PROT 8.4 07/26/2017 1534   ALBUMIN 5.0 (H) 07/26/2017 1534   AST 31 07/26/2017 1534   ALT 31 07/26/2017 1534   ALKPHOS 78 07/26/2017 1534   BILITOT 0.8 07/26/2017 1534   GFRNONAA 80 07/26/2017 1534   GFRNONAA 84 12/11/2014 1119   GFRAA 92 07/26/2017 1534   GFRAA >89 12/11/2014 1119       Component Value Date/Time   WBC 10.4 07/26/2017 1534   WBC 9.9 12/11/2014 1130   RBC 5.61  07/26/2017 1534   RBC 5.69 12/11/2014 1130   HGB 18.0 (H) 07/26/2017 1534   HCT 51.8 (H) 07/26/2017 1534   PLT 296 07/26/2017  1534   MCV 92 07/26/2017 1534   MCH 32.1 07/26/2017 1534   MCH 29.3 12/11/2014 1130   MCHC 34.7 07/26/2017 1534   MCHC 31.7 (A) 12/11/2014 1130   RDW 13.7 07/26/2017 1534   LYMPHSABS 3.1 07/26/2017 1534   EOSABS 0.4 07/26/2017 1534   BASOSABS 0.0 07/26/2017 1534    No results found for: POCLITH, LITHIUM   No results found for: PHENYTOIN, PHENOBARB, VALPROATE, CBMZ   .res Assessment: Plan:    Mixed obsessional thoughts and acts - Plan: cariprazine (VRAYLAR) capsule, sertraline (ZOLOFT) 100 MG tablet  Severe episode of recurrent major depressive disorder, without psychotic features (HCC) - Plan: cariprazine (VRAYLAR) capsule  Insomnia due to mental condition - Plan: LORazepam (ATIVAN) 1 MG tablet  Depression, major, recurrent, mild (HCC) - Plan: sertraline (ZOLOFT) 100 MG tablet  Rule out excessive sedation fatigue from medication  There is also question whether there may be an underlying bipolar predisposition because of his manic symptoms that he had around December 2018 which were quite dramatic according to his wife.  However it was at a time when he had abruptly discontinued paroxetine.  Greater than 50% of 30 min face to face time with patient was spent on counseling and coordination of care. We discussed the following.  Agree with seeking 2 nd opinion for TR OCD and depression.  Discussed options.  Also he can bring his wife if he thinks that will help.  Consider lamotrigine, lithium, Vraylar, Rexulti.  Consider ECT.  Peyton Najjar got better very quickly with regard to the OCD and anxiety and resolution of suicidal thoughts after starting sertraline and Abilify initially.  He had severe symptoms of OCD and depression set off by his retirement.   However he is bothered now by continued general worry and some obsessing especially about money but no significant checking.  He is also bothered by low motivation and low interest and mild cognitive problems.  With the input from his wife  it is clear that he is severely depressed and he reports that the depression got worse off the Abilify and nothing is better.  So we started the relative Vraylar.  Overall it appears to me that he has had improvement in the OCD but has general worry and the other problems noted above.  Discussed options.  It would be very risky to change the sertraline especially given he is tried all the major alternatives except fluoxetine which does not tend to be as effective.   Continue sertraline 200 mg daily.  Due to diarrhea and tiredness, to see if motivation is better and no apparent additional benefit at the higher dosages up to 300 mg daily.  It has helped the OCD but still a lot of general worry.  Continue Vraylar 1.5 mg samples 1 daily DT partial response.  Consider increase if necessary.   Disc pros/cons of this  Consider switch to fluoxetine as the only other FDA approved OCD med he's not taken. Consider lamotrigine for TRD and TR OCD  Try to stay busy bc lack of activity worsens his OCD.  Disc course progression of OCD and length of therapy required to achieve response.  Treatment of OCD typically requires along trials with serotonin medications.   Discussed potential metabolic side effects associated with atypical antipsychotics, as well as potential risk for movement side effects.  Advised pt to contact office if movement side effects occur.  There is none evident at this time.  Continue the  Lorazepam 2 HS which worked for lseep before.  Continue those as you are now. We discussed the short-term risks associated with benzodiazepines including sedation and increased fall risk among others.  Discussed long-term side effect risk including dependence, potential withdrawal symptoms, and the potential eventual dose-related risk of dementia.  His wife is encouraging to have counseling which is never done before.  He agrees.  Continue with  Dr. Rica Mote for his OCD and depression  Follow-up in 6-8  weeks.  Lynder Parents MD, DFAPA  Please see After Visit Summary for patient specific instructions.  Future Appointments  Date Time Provider [place]  04/09/2019  2:00 PM Blanchie Serve, PhD CP-CP None    No orders of the defined types were placed in this encounter.     -------------------------------

## 2019-04-09 ENCOUNTER — Ambulatory Visit (INDEPENDENT_AMBULATORY_CARE_PROVIDER_SITE_OTHER): Payer: BC Managed Care – PPO | Admitting: Psychiatry

## 2019-04-09 ENCOUNTER — Other Ambulatory Visit: Payer: Self-pay

## 2019-04-09 DIAGNOSIS — F330 Major depressive disorder, recurrent, mild: Secondary | ICD-10-CM | POA: Diagnosis not present

## 2019-04-09 DIAGNOSIS — F422 Mixed obsessional thoughts and acts: Secondary | ICD-10-CM | POA: Diagnosis not present

## 2019-04-09 NOTE — Progress Notes (Signed)
Psychotherapy Progress Note [facility] Mark Czar, PhD LP  Patient ID: Mark Estrada     MRN: [id number]     Therapy format: Individual psychotherapy Date: 04/09/2019      Start: 2:04p     Stop: 2:53p     Time Spent: 49 min Location: In-person   Session narrative (presenting needs, interim history, self-report of stressors and symptoms, applications of prior therapy, status changes, and interventions made in session) Med check 1/6, since seen initially.  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  Mention of manic symptoms and question of bipolar disposition, though Dr. Alwyn Estrada analysis that this happened during abrupt d/c SSRI and could have been more of an excessive anxiety reaction.  Nevertheless, on Mark Estrada, helpful (after helpful combination of Mark Estrada and Mark Estrada).  Pt says 2nd opinion was not discussed last time -- must be an inadvertent carry forward.  Quick review of manic sxs does seem to confirm no bipolar suspicion.  Has done some journaling.  No notable observations of anxiety nor having to fight his mind in any memorable way.  Main concern right now is adequate work.  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  Rates mood 6/10, maybe 7, does not label now depression.  Estimate 2 weeks ago, not exactly depressed, either, but a little moody.  Has interviewed for Mark Estrada, an attractive position, but anticipates challenges reading up on unfamiliar areas of [place], and [place] history.  Well-pleased with first impressions of faculty, staff, and students.  Figures there will be a higher level of interest and effort both for himself and among students, and no significant expectation of discipline problems.  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    Discussed wife's observations, concerns.  Bothers her that her own health concerns prevent working significantly.  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  [place] apparently has a law requiring OB/GYN to supervise, they lost theirs, had to close in November, in what he and she deem to be a fallout of Mark Estrada, Mark Estrada.  Support/empathy provided.   Anxiety rated 7-8/10 as of early December, attributed largely to uncertainty over his income and work.  Now 5/10.  Would have anxiety go up if currently found out he is hired for [employer]ada (due to having to prepare and worry about performance).  By comparison, being called back to Mark Estrada would be much more comfortable, like 1-2 of 10.   Discussed likely worries -- different material, pronunciation issues, Engineering geologist.  Denies phobia or paralyzing anxiety.  Discussed the rapport value in being open about uncertainties, modeling honesty and OK to be learning for young.  For stress management, oriented to a 1-minute mindfulness technique as a help in breaking from hypnotic/anxious attention.    Probed how OCD would try to manifest if it tried to make a "comeback" -- mainly body worries, hypochondriasis.  Figures he has a fair likelihood of worrying over something.  Diaphragmatic breathing technique begun last time has been helpful.  Discussed reassurance-seeking as a place he can set up further help by asking wife Mark Estrada to be ready to respond with, "I can tell you, but do you really need me to?"  Therapeutic modalities:  Cognitive Behavioral Therapy and Solution-Oriented/Positive Psychology  Mental Status/Observations:  Appearance:   Casual     Behavior:  Appropriate  Motor:  Normal  Speech/Language:   Clear and Coherent  Affect:  Appropriate  Mood:  anxious and less  Thought process:  normal  Thought content:    WNL   Sensory/Perceptual disturbances:    WNL  Orientation:  Fully oriented  Attention:  Good  Concentration:  Good  Memory:  WNL  Insight:    Good  Judgment:   Good  Impulse Control:  Good   Risk Assessment: Danger to Self: No Self-injurious Behavior: No Danger to Others: No Physical Aggression / Violence: No Duty to Warn: No Access to Firearms a concern: No  Assessment of progress:  progressing well  Diagnosis: No diagnosis found. Plan:  . 1-minute mindfulness routine . Continue diaphragmatic breathing practice . Journal ad lib . Set up wife's response to reassurance-seeking . Come back to cognitive appraisal of body/health worries, should they try to surface . Come back to sleep issues, if still present . Other recommendations/advice as noted above . Continue to utilize previously learned skills ad lib . Maintain medication as prescribed and work faithfully with relevant prescriber(s) if any changes are desired or seem indicated . Call the clinic on-call service, present to ER, or call 911 if any life-threatening psychiatric crisis . No follow-ups on file. Next scheduled in this office 05/27/2019  Mark Serve, PhD Mark Moore, PhD LP Clinical Psychologist, [facility] [address] [place], [place] [address] [phone]

## 2019-05-11 ENCOUNTER — Telehealth: Payer: Self-pay

## 2019-05-11 NOTE — Telephone Encounter (Signed)
Prior authorization submitted through [facility] for VRAYLAR 1.5 mg approved effective 05/08/2019-05/07/2022  GY#[id number]  Submitted through cover my meds

## 2019-05-13 ENCOUNTER — Other Ambulatory Visit: Payer: Self-pay

## 2019-05-13 ENCOUNTER — Ambulatory Visit (INDEPENDENT_AMBULATORY_CARE_PROVIDER_SITE_OTHER): Payer: BC Managed Care – PPO | Admitting: Psychiatry

## 2019-05-13 DIAGNOSIS — F422 Mixed obsessional thoughts and acts: Secondary | ICD-10-CM | POA: Diagnosis not present

## 2019-05-13 DIAGNOSIS — F330 Major depressive disorder, recurrent, mild: Secondary | ICD-10-CM | POA: Diagnosis not present

## 2019-05-13 NOTE — Progress Notes (Signed)
Psychotherapy Progress Note [facility] [name], PhD LP  Patient ID: Mark Estrada     MRN: [id number] Therapy format: Individual psychotherapy Date: 05/13/2019      Start: 6:12p     Stop: 7:01p     Time Spent: 49 min Location: In-person   Session narrative (presenting needs, interim history, self-report of stressors and symptoms, applications of prior therapy, status changes, and interventions made in session) Didn't get the [employer] job, but it's OK, would have meant a lot of new learning about [place] history.  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  More success disputing anxious thoughts, been "testing" self, begging the question whether a worry is really worth going on with.  Affirmed the more active approach to his own thoughts.  Says there have been times where med changes were leaving him more wrung out, suspected for worrying harder over whether they would work.  Discussed the negative placebo effect of med changes (can subtly reinforce sense of helplessness/dependency, or set up false hopes that become wearying/depressing over time) and affirmed that he is and can be an active participant in managing his own worries.  Discussed the "what if" game, familiar to him, highlighting the two "rules" (actually answer an actual question, and ask the non-catastrophic question, too) and framing worry as problem-solving that "got stuck" horrified by the problem (can't work it through, can't turn away).  Also discussed more deliberate attention control -- hx noted of obsessing over skin conditions and becoming hypochondriacal, wife having him put Band-Aid on places so he would stop seeing them and break the spell.  Resolved to take some worries in the next month, write them down, write out his "what if" questions, and try to write out  answers what he would actually do if the feared consequences started to come true.  Therapeutic modalities: Cognitive Behavioral Therapy and Solution-Oriented/Positive Psychology  Mental Status/Observations:  Appearance:   Casual     Behavior:  Appropriate  Motor:  Normal  Speech/Language:   Clear and Coherent  Affect:  Appropriate  Mood:  more euthymic, some anxiety/worry  Thought process:  normal  Thought content:    worry, less  Sensory/Perceptual disturbances:    WNL  Orientation:  Fully oriented  Attention:  Good  Concentration:  Good  Memory:  WNL  Insight:    Fair  Judgment:   Good  Impulse Control:  Good   Risk Assessment: Danger to Self: No Self-injurious Behavior: No Danger to Others: No Physical Aggression / Violence: No Duty to Warn: No Access to Firearms a concern: No  Assessment of progress:  progressing  Diagnosis:   ICD-10-CM   1. Mixed obsessional thoughts and acts  F42.2   2. Depression, major, recurrent, mild (HCC)  F33.0    Plan:  . 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 Try journaling about OCD moments . Other recommendations/advice as may be noted above . Continue to utilize previously learned skills ad lib . Maintain medication as prescribed and work faithfully with relevant prescriber(s) if any changes are desired or seem indicated . Call the clinic on-call service, present to ER, or call 911 if any life-threatening psychiatric crisis . Return in about 1 month (around 06/10/2019) for time as available.Marland Kitchen  Next scheduled visit in this office 05/27/2019.  Robley Fries, PhD [name], PhD LP Clinical Psychologist, [facility]  Group [facility] [address] [place], [place] [address] [phone]

## 2019-05-27 ENCOUNTER — Encounter: Payer: Self-pay | Admitting: Psychiatry

## 2019-05-27 ENCOUNTER — Ambulatory Visit (INDEPENDENT_AMBULATORY_CARE_PROVIDER_SITE_OTHER): Payer: BC Managed Care – PPO | Admitting: Psychiatry

## 2019-05-27 ENCOUNTER — Other Ambulatory Visit: Payer: Self-pay

## 2019-05-27 DIAGNOSIS — F331 Major depressive disorder, recurrent, moderate: Secondary | ICD-10-CM | POA: Diagnosis not present

## 2019-05-27 DIAGNOSIS — N522 Drug-induced erectile dysfunction: Secondary | ICD-10-CM

## 2019-05-27 DIAGNOSIS — F5105 Insomnia due to other mental disorder: Secondary | ICD-10-CM | POA: Diagnosis not present

## 2019-05-27 DIAGNOSIS — F422 Mixed obsessional thoughts and acts: Secondary | ICD-10-CM | POA: Diagnosis not present

## 2019-05-27 MED ORDER — SILDENAFIL CITRATE 100 MG PO TABS: 100.0000 mg | ORAL_TABLET | Freq: Every day | ORAL | 0 refills | Status: DC | PRN

## 2019-05-27 NOTE — Progress Notes (Signed)
June Vacha [id number] 05/24/1955 64 y.o.  Subjective:   Patient ID:  Mark Estrada is a 64 y.o. (DOB 12-21-1955) male.  Chief Complaint:  Chief Complaint  Patient presents with  . Depression  . Anxiety  . Follow-up    OCD and med changes    HPI: Mark Estrada presents to the office today for follow-up of unmanaged depression and OCD.  He has been fairly unstable for several months.  And at visit May 21, 2018 we increased sertraline from 150 to 200 mg a day  And then 250 about a month ago.  wean off the clomipramine since it did not seem necessary to continue both.  in April when it was recommended he increase sertraline to 300 mg and Abilify to 10 mg daily.  When seen September 2020.  The following adjustments were made: Continue sertraline  300 mg (1 in AM and 2 HS).  Discussed the higher than usual dosage and studies that support this.  It has helped the OCD but still a lot of general worry. Stop Abilify to see if it's causing fatigue. If depression or anxiety worsens, then start samples of Rexulti 1 mg for 1 week, then 2 mg daily.  He never took Rexulti.    W wants him to get a 2nd opinion.  Disc this.  Her concern is he is not himself, gloomy and low energy and motivation.  At his appointment in September he was encouraged again with his wife's presents to get a second opinion about his treatment.  seen February 09, 2019. Energy no better off Abilify and gradually depression and anxiety got worse.  Mind racing at night interferes with sleep and gets 2-3 hours of sleep.  Repeated awakenings.  Continued obsessions and depression. Since the increase in sertraline to 300 mg had not been additionally helpful it was reduced to 250 mg daily.  He was started off label Vraylar 1.5 mg daily because of some mood benefit from the Abilify but insufficient effects.  seen Feb 26, 2019.  Sertraline was reduced to 200 mg daily due to diarrhea and tiredness and to see if  motivation was improved and because of no apparent additional benefit at 300 mg daily.  He was continued on Vraylar 1.5 mg daily which he's now been on almost 6 weeks and lorazepam 2 mg nightly . A little more energy with the Vraylar.  Mild improvement in home activity level.  Wife hasn't said if she notices.  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  Hopes to do it the fullly allowed 3 1/2 days/week.  Loose  BM , no cramps once daily.  Sleep usually ok with lorazepam.  Better with hygiene.  Last couple of years periods of mania with excessive spending and hyperverbal speech.  He never spends money.  This happened when he retired.  Talked pressured and lasted a month or more.  She thinks it happened 2 years ago after  [place] stopping some meds.  Last seen April 01, 2019.  He was continued on sertraline 200 mg daily and Vraylar 1.5 mg for a longer trial of the Vraylar.  Thinks Vraylar is helping some.  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  OCD not bothering him that much.  Mood is a little better with Vraylar.  No SE.  Sleep pretty good and normal hours.  Not a lot of things to enjoy.  Never really had hobbies.    Addnl stress Covid  affecting income and therefore more idle as well which doesn't help.  3 online classes being taught. Wife lost her job.     No sig change in the following sx except as noted.  Pt reports that mood is flat and less anxious and obsessive.  Pt reports no sleep issues and 7 hours with Ativan 2 mg. Pt reports that appetite is good. Pt reports that energy is is a lot better and less anhedonia, loss of interest  poor motivation and more active. Concentration is better. Suicidal thoughts:  denied by patient and occ death thoughts. Also some cognitive problems with mistakes and oversights that are unusual for him.  Less obsessing on health and more on money/job.  Other psych med trials include: Clomipramine helped the depression but not the OCD,  Paroxetine  80,  Sertraline 300  Sertraline started early December 2019 Fluvoxamine (both of which eventually failed even at high dosage)  Olanzapine,  Quetiapine,  Aripiprazole,  Risperidone 1 mg Dec 2018 SE loopy  Never took Rexulti Vraylar 1.5 mg daily helped for sleep trazodone 50 which failed and temazepam and zolpidem which caused amnesia    Review of Systems:  Review of Systems  Gastrointestinal: Positive for diarrhea. Negative for abdominal distention, abdominal pain, constipation and nausea.       Heartburn resolved  Neurological: Negative for tremors and weakness.  Psychiatric/Behavioral: Positive for dysphoric mood. Negative for agitation, behavioral problems, confusion, decreased concentration, hallucinations, self-injury, sleep disturbance and suicidal ideas. The patient is nervous/anxious. The patient is not hyperactive.     Medications: I have reviewed the patient's current medications.  Current Outpatient Medications  Medication Sig Dispense Refill  . cariprazine (VRAYLAR) capsule Take 1 capsule (1.5 mg total) by mouth daily. 30 capsule 1  . LORazepam (ATIVAN) 1 MG tablet Take 2 tablets (2 mg total) by mouth at bedtime. 60 tablet 1  . propranolol ER (INDERAL LA) 120 MG 24 hr capsule TK 1 C PO D    . sertraline (ZOLOFT) 100 MG tablet Take 2 tablets (200 mg total) by mouth daily. 180 tablet 0   No current facility-administered medications for this visit.    Medication Side Effects: Other: GI issues.  Miralax helped. diarrhea  Allergies:  Allergies  Allergen Reactions  . Erythromycin     Itching and hives     Past Medical History:  Diagnosis Date  . Anxiety   . Depression   . OCD (obsessive compulsive disorder) 1998    Family History  Problem Relation Age of Onset  . Heart disease Father   . Hyperlipidemia Brother     Social History   Socioeconomic History  . Marital status: Married    Spouse name: [name]  . Number of children: 1  . Years of education:  Master's  . Highest education level: Not on file  Occupational History  . Occupation: History Teacher    Comment: Psychiatrist  Tobacco Use  . Smoking status: Never Smoker  . Smokeless tobacco: Never Used  Substance and Sexual Activity  . Alcohol use: No  . Drug use: No  . Sexual activity: Not on file  Other Topics Concern  . Not on file  Social History Narrative   Lives with his wife and their daughter.   Social Determinants of Health   Financial Resource Strain:   . Difficulty of Paying Living Expenses: Not on file  Food Insecurity:   . Worried About Programme researcher, broadcasting/film/video in the Last Year: Not on  file  . [place] in the Last Year: Not on file  Transportation Needs:   . Lack of Transportation (Medical): Not on file  . Lack of Transportation (Non-Medical): Not on file  Physical Activity:   . Days of Exercise per Week: Not on file  . Minutes of Exercise per Session: Not on file  Stress:   . Feeling of Stress : Not on file  Social Connections:   . Frequency of Communication with Friends and Family: Not on file  . Frequency of Social Gatherings with Friends and Family: Not on file  . Attends Religious Services: Not on file  . Active Member of Clubs or Organizations: Not on file  . Attends Archivist Meetings: Not on file  . Marital Status: Not on file  Intimate Partner Violence:   . Fear of Current or Ex-Partner: Not on file  . Emotionally Abused: Not on file  . Physically Abused: Not on file  . Sexually Abused: Not on file    Past Medical History, Surgical history, Social history, and Family history were reviewed and updated as appropriate.   Please see review of systems for further details on the patient's review from today.   Objective:   Physical Exam:  There were no vitals taken for this visit.  Physical Exam Constitutional:      General: He is not in acute distress.    Appearance: He is well-developed.  Musculoskeletal:         General: No deformity.  Neurological:     Mental Status: He is alert and oriented to person, place, and time.     Cranial Nerves: No dysarthria.     Motor: No tremor.     Coordination: Coordination normal.  Psychiatric:        Attention and Perception: Attention and perception normal. He does not perceive auditory or visual hallucinations.        Mood and Affect: Mood is depressed. Mood is not anxious. Affect is not labile, blunt, angry or inappropriate.        Speech: Speech normal. Speech is not slurred.        Behavior: Behavior normal. Behavior is cooperative.        Thought Content: Thought content is not paranoid or delusional. Thought content does not include homicidal or suicidal ideation. Thought content does not include homicidal or suicidal plan.        Cognition and Memory: Cognition and memory normal.        Judgment: Judgment normal.     Comments: OCD is noted.  His affect is less anxious but still a little blunted.  Not much objective change since reduction in sertraline. Anxiety resolved mostly and still some flatness     Lab Review:     Component Value Date/Time   NA 142 07/26/2017 1534   K 4.6 07/26/2017 1534   CL 100 07/26/2017 1534   CO2 22 07/26/2017 1534   GLUCOSE 109 (H) 07/26/2017 1534   GLUCOSE 99 12/11/2014 1119   BUN 11 07/26/2017 1534   CREATININE 1.02 07/26/2017 1534   CREATININE 0.99 12/11/2014 1119   CALCIUM 10.4 (H) 07/26/2017 1534   PROT 8.4 07/26/2017 1534   ALBUMIN 5.0 (H) 07/26/2017 1534   AST 31 07/26/2017 1534   ALT 31 07/26/2017 1534   ALKPHOS 78 07/26/2017 1534   BILITOT 0.8 07/26/2017 1534   GFRNONAA 80 07/26/2017 1534   GFRNONAA 84 12/11/2014 1119   GFRAA 92 07/26/2017 1534  GFRAA >89 12/11/2014 1119       Component Value Date/Time   WBC 10.4 07/26/2017 1534   WBC 9.9 12/11/2014 1130   RBC 5.61 07/26/2017 1534   RBC 5.69 12/11/2014 1130   HGB 18.0 (H) 07/26/2017 1534   HCT 51.8 (H) 07/26/2017 1534   PLT 296 07/26/2017 1534    MCV 92 07/26/2017 1534   MCH 32.1 07/26/2017 1534   MCH 29.3 12/11/2014 1130   MCHC 34.7 07/26/2017 1534   MCHC 31.7 (A) 12/11/2014 1130   RDW 13.7 07/26/2017 1534   LYMPHSABS 3.1 07/26/2017 1534   EOSABS 0.4 07/26/2017 1534   BASOSABS 0.0 07/26/2017 1534    No results found for: POCLITH, LITHIUM   No results found for: PHENYTOIN, PHENOBARB, VALPROATE, CBMZ   .res Assessment: Plan:    Major depressive disorder, recurrent episode, moderate (HCC)  Mixed obsessional thoughts and acts  Insomnia due to mental condition  Drug-induced erectile dysfunction  Rule out excessive sedation fatigue from medication  There is also question whether there may be an underlying bipolar predisposition because of his manic symptoms that he had around December 2018 which were quite dramatic according to his wife.  However it was at a time when he had abruptly discontinued paroxetine.  Greater than 50% of 30 min face to face time with patient was spent on counseling and coordination of care. We discussed the following.  Agree with seeking 2 nd opinion for TR OCD and depression.  Discussed options.  Also he can bring his wife if he thinks that will help.  Consider lamotrigine, lithium, Vraylar, Rexulti.  Consider ECT.  Peyton Najjar got better very quickly with regard to the OCD and anxiety and resolution of suicidal thoughts after starting sertraline and Abilify initially.  He had severe symptoms of OCD and depression set off by his retirement.   However he is bothered now  low motivation and low interest and mild cognitive problems.  Leafy Kindle has improve the depression and his anxiety and OCD are largely under control but his flatness has not entirely resolved.  It is improving gradually.  Overall it appears to me that he has had improvement in the OCD and general worry and the other problems noted above.  Discussed options.  It would be very risky to change the sertraline especially given he is tried all  the major alternatives except fluoxetine which does not tend to be as effective.   Continue sertraline 200 mg daily.  Due to diarrhea and tiredness, to see if motivation is better and no apparent additional benefit at the higher dosages up to 300 mg daily.  It has helped the OCD but still a lot of general worry.  Continue Vraylar 1.5 mg samples 1 daily DT partial response.  Consider increase if necessary.   Disc pros/cons of this.  Sometimes this makes things worse so I am hesitant to change any meds today.  No med changes today.  Other than okay to restart Viagra  Encourage reading and hobby development for relapse prevention. Try to stay busy bc lack of activity worsens his OCD.  Consider switch to fluoxetine as the only other FDA approved OCD med he's not taken. 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  Treatment of OCD typically requires along trials with serotonin medications.   Discussed potential metabolic side effects associated with atypical antipsychotics, as well as potential risk for movement side effects. Advised  pt to contact office if movement side effects occur.  There is none evident at this time.  Continue the  Lorazepam 2 HS which worked for lseep before.  Continue those as you are now. We discussed the short-term risks associated with benzodiazepines including sedation and increased fall risk among others.  Discussed long-term side effect risk including dependence, potential withdrawal symptoms, and the potential eventual dose-related risk of dementia.  His wife is encouraging to have counseling which is never done before.  He agrees.  Continue with  Dr. Farrel Demark for his OCD and depression  OK return to Viagra prn.  Disc SE  Follow-up in 3 months  Meredith Staggers MD, DFAPA  Please see After Visit Summary for patient specific instructions.  No future appointments.  No orders of the defined  types were placed in this encounter.     -------------------------------

## 2019-05-28 ENCOUNTER — Telehealth: Payer: Self-pay

## 2019-05-28 NOTE — Telephone Encounter (Signed)
Pt left message asking nurse to return his call @ [phone]

## 2019-05-28 NOTE — Telephone Encounter (Signed)
Patient's insurance will not cover Sildenafil, they recommend changing to formularies. Dr. Jennelle Human notified and recommend patient change pharmacy to [facility] and use Good RX coupon #30 for $15.00   Will contact patient with information.

## 2019-05-29 ENCOUNTER — Other Ambulatory Visit: Payer: Self-pay

## 2019-05-29 DIAGNOSIS — N522 Drug-induced erectile dysfunction: Secondary | ICD-10-CM

## 2019-05-29 MED ORDER — SILDENAFIL CITRATE 100 MG PO TABS: 100.0000 mg | ORAL_TABLET | Freq: Every day | ORAL | 0 refills | Status: DC | PRN

## 2019-05-29 NOTE — Telephone Encounter (Signed)
I left him A detailed message and asked him to return our c all with any questions.

## 2019-05-29 NOTE — Telephone Encounter (Signed)
Mark Estrada called back to acknowledge that he can get the sildenafil from [facility].  He said to call it to the [facility] at [facility].  He will download a GoodRx coupon to take with him.

## 2019-05-29 NOTE — Telephone Encounter (Signed)
Noted and RX submitted

## 2019-05-29 NOTE — Telephone Encounter (Signed)
Noted thank you

## 2019-06-08 ENCOUNTER — Other Ambulatory Visit: Payer: Self-pay | Admitting: Psychiatry

## 2019-06-08 DIAGNOSIS — F332 Major depressive disorder, recurrent severe without psychotic features: Secondary | ICD-10-CM

## 2019-06-08 DIAGNOSIS — F422 Mixed obsessional thoughts and acts: Secondary | ICD-10-CM

## 2019-07-02 ENCOUNTER — Other Ambulatory Visit: Payer: Self-pay | Admitting: Psychiatry

## 2019-07-02 DIAGNOSIS — F330 Major depressive disorder, recurrent, mild: Secondary | ICD-10-CM

## 2019-07-02 DIAGNOSIS — F422 Mixed obsessional thoughts and acts: Secondary | ICD-10-CM

## 2019-07-28 ENCOUNTER — Other Ambulatory Visit: Payer: Self-pay | Admitting: Psychiatry

## 2019-07-28 DIAGNOSIS — F5105 Insomnia due to other mental disorder: Secondary | ICD-10-CM

## 2019-08-02 ENCOUNTER — Other Ambulatory Visit: Payer: Self-pay | Admitting: Psychiatry

## 2019-08-02 DIAGNOSIS — F332 Major depressive disorder, recurrent severe without psychotic features: Secondary | ICD-10-CM

## 2019-08-02 DIAGNOSIS — F422 Mixed obsessional thoughts and acts: Secondary | ICD-10-CM

## 2019-08-27 ENCOUNTER — Other Ambulatory Visit: Payer: Self-pay

## 2019-08-27 ENCOUNTER — Encounter: Payer: Self-pay | Admitting: Psychiatry

## 2019-08-27 ENCOUNTER — Ambulatory Visit (INDEPENDENT_AMBULATORY_CARE_PROVIDER_SITE_OTHER): Payer: BC Managed Care – PPO | Admitting: Psychiatry

## 2019-08-27 DIAGNOSIS — F5105 Insomnia due to other mental disorder: Secondary | ICD-10-CM | POA: Diagnosis not present

## 2019-08-27 DIAGNOSIS — F330 Major depressive disorder, recurrent, mild: Secondary | ICD-10-CM

## 2019-08-27 DIAGNOSIS — F332 Major depressive disorder, recurrent severe without psychotic features: Secondary | ICD-10-CM

## 2019-08-27 DIAGNOSIS — F422 Mixed obsessional thoughts and acts: Secondary | ICD-10-CM

## 2019-08-27 MED ORDER — LORAZEPAM 1 MG PO TABS: 2.0000 mg | ORAL_TABLET | Freq: Every day | ORAL | 5 refills | Status: DC

## 2019-08-27 MED ORDER — CARIPRAZINE HCL 1.5 MG PO CAPS: 1.5000 mg | ORAL_CAPSULE | Freq: Every day | ORAL | 5 refills | Status: DC

## 2019-08-27 MED ORDER — SERTRALINE HCL 100 MG PO TABS: 200.0000 mg | ORAL_TABLET | Freq: Every day | ORAL | 1 refills | Status: DC

## 2019-08-27 NOTE — Progress Notes (Signed)
Mark Estrada [id number] [birth year] 64 y.o.  Subjective:   Patient ID:  Mark Estrada is a 64 y.o. (DOB [birth year]) male.  Chief Complaint:  Chief Complaint  Patient presents with  . Follow-up  . Anxiety  . Depression  . Sleeping Problem    HPI: Mark Estrada presents to the office today for follow-up of unmanaged depression and OCD.  He has been fairly unstable for several months.  And at visit May 21, 2018 we increased sertraline from 150 to 200 mg a day  And then 250 about a month ago.  wean off the clomipramine since it did not seem necessary to continue both.  in April when it was recommended he increase sertraline to 300 mg and Abilify to 10 mg daily.  When seen September 2020.  The following adjustments were made: Continue sertraline  300 mg (1 in AM and 2 HS).  Discussed the higher than usual dosage and studies that support this.  It has helped the OCD but still a lot of general worry. Stop Abilify to see if it's causing fatigue. If depression or anxiety worsens, then start samples of Rexulti 1 mg for 1 week, then 2 mg daily.  He never took Rexulti.    W wants him to get a 2nd opinion.  Disc this.  Her concern is he is not himself, gloomy and low energy and motivation.  At his appointment in September he was encouraged again with his wife's presents to get a second opinion about his treatment.  seen February 09, 2019. Energy no better off Abilify and gradually depression and anxiety got worse.  Mind racing at night interferes with sleep and gets 2-3 hours of sleep.  Repeated awakenings.  Continued obsessions and depression. Since the increase in sertraline to 300 mg had not been additionally helpful it was reduced to 250 mg daily.  He was started off label Vraylar 1.5 mg daily because of some mood benefit from the Abilify but insufficient effects.  seen Feb 26, 2019.  Sertraline was reduced to 200 mg daily due to diarrhea and tiredness and to see if  motivation was improved and because of no apparent additional benefit at 300 mg daily.  He was continued on Vraylar 1.5 mg daily which he's now been on almost 6 weeks and lorazepam 2 mg nightly . A little more energy with the Vraylar.  Mild improvement in home activity level.  Wife hasn't said if she notices.  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  Hopes to do it the fullly allowed 3 1/2 days/week.  Loose  BM , no cramps once daily.  Sleep usually ok with lorazepam.  Better with hygiene.  Last couple of years periods of mania with excessive spending and hyperverbal speech.  He never spends money.  This happened when he retired.  Talked pressured and lasted a month or more.  She thinks it happened 2 years ago after  [place] stopping some meds.  Last seen April 01, 2019.  He was continued on sertraline 200 mg daily and Vraylar 1.5 mg for a longer trial of the Vraylar.   Last seen March 2021 with the following noted: No meds were changed. Thinks Vraylar is helping some.  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  OCD not bothering him that much.  Mood is a little better with Vraylar.  No SE.  Sleep pretty good and normal hours.  Not a lot of things to enjoy.  Never really had hobbies.    08/27/2019 appointment with the following noted: About the same with maybe slight improvement.  Still a little more anxiety than depression.  OCD is manageable.  Still feels the Leafy Kindle is helpful.  No SE problems. 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    No sig change in the following sx except as noted.  Pt reports that mood is flat and less anxious and obsessive.  Pt reports no sleep issues and 7 hours with Ativan 2 mg. Pt reports that appetite is good. Pt reports that energy is is a lot better and less anhedonia, loss of interest  poor motivation and more active. Concentration is better. Suicidal thoughts:  denied by patient and occ death thoughts. Also some cognitive problems with mistakes  and oversights that are unusual for him.  Less obsessing on health and more on money/job.  Other psych med trials include: Clomipramine helped the depression but not the OCD,  Paroxetine 80,  Sertraline 300  Sertraline started early December 2019 Fluvoxamine (both of which eventually failed even at high dosage)  Olanzapine,  Quetiapine,  Aripiprazole,  Risperidone 1 mg Dec 2018 SE loopy  Never took Rexulti Vraylar 1.5 mg daily helped for sleep trazodone 50 which failed and temazepam and zolpidem which caused amnesia    Review of Systems:  Review of Systems  Gastrointestinal: Positive for diarrhea. Negative for abdominal distention, abdominal pain, constipation and nausea.       Heartburn resolved  Neurological: Negative for tremors and weakness.  Psychiatric/Behavioral: Positive for dysphoric mood. Negative for agitation, behavioral problems, confusion, decreased concentration, hallucinations, self-injury, sleep disturbance and suicidal ideas. The patient is nervous/anxious. The patient is not hyperactive.     Medications: I have reviewed the patient's current medications.  Current Outpatient Medications  Medication Sig Dispense Refill  . cariprazine (VRAYLAR) capsule Take 1 capsule (1.5 mg total) by mouth daily. 30 capsule 5  . LORazepam (ATIVAN) 1 MG tablet Take 2 tablets (2 mg total) by mouth at bedtime. 60 tablet 5  . propranolol ER (INDERAL LA) 120 MG 24 hr capsule TK 1 C PO D    . sertraline (ZOLOFT) 100 MG tablet Take 2 tablets (200 mg total) by mouth daily. 180 tablet 1  . sildenafil (VIAGRA) 100 MG tablet Take 1 tablet (100 mg total) by mouth daily as needed for erectile dysfunction. 30 tablet 0   No current facility-administered medications for this visit.    Medication Side Effects: Other: GI issues.  Miralax helped. diarrhea  Allergies:  Allergies  Allergen Reactions  . Erythromycin     Itching and hives     Past Medical History:  Diagnosis Date  . Anxiety    . Depression   . OCD (obsessive compulsive disorder) 1998    Family History  Problem Relation Age of Onset  . Heart disease Father   . Hyperlipidemia Brother     Social History   Socioeconomic History  . Marital status: Married    Spouse name: [name]  . Number of children: 1  . Years of education: Master's  . Highest education level: Not on file  Occupational History  . Occupation: History Teacher    Comment: Psychiatrist  Tobacco Use  . Smoking status: Never Smoker  . Smokeless tobacco: Never Used  Substance and Sexual Activity  . Alcohol use: No  . Drug use: No  . Sexual activity: Not on file  Other Topics Concern  . Not on file  Social History Narrative   Lives with his wife and their daughter.   Social Determinants of Health   Financial Resource Strain:   . Difficulty of Paying Living Expenses:   Food Insecurity:   . Worried About Charity fundraiser in the Last Year:   . Arboriculturist in the Last Year:   Transportation Needs:   . Film/video editor (Medical):   Marland Kitchen Lack of Transportation (Non-Medical):   Physical Activity:   . Days of Exercise per Week:   . Minutes of Exercise per Session:   Stress:   . Feeling of Stress :   Social Connections:   . Frequency of Communication with Friends and Family:   . Frequency of Social Gatherings with Friends and Family:   . Attends Religious Services:   . Active Member of Clubs or Organizations:   . Attends Archivist Meetings:   Marland Kitchen Marital Status:   Intimate Partner Violence:   . Fear of Current or Ex-Partner:   . Emotionally Abused:   Marland Kitchen Physically Abused:   . Sexually Abused:     Past Medical History, Surgical history, Social history, and Family history were reviewed and updated as appropriate.   Please see review of systems for further details on the patient's review from today.   Objective:   Physical Exam:  There were no vitals taken for this visit.  Physical  Exam Constitutional:      General: He is not in acute distress.    Appearance: He is well-developed.  Musculoskeletal:        General: No deformity.  Neurological:     Mental Status: He is alert and oriented to person, place, and time.     Cranial Nerves: No dysarthria.     Motor: No tremor.     Coordination: Coordination normal.  Psychiatric:        Attention and Perception: Attention and perception normal. He does not perceive auditory or visual hallucinations.        Mood and Affect: Mood is depressed. Mood is not anxious. Affect is not labile, blunt, angry or inappropriate.        Speech: Speech normal. Speech is not slurred.        Behavior: Behavior normal. Behavior is cooperative.        Thought Content: Thought content is not paranoid or delusional. Thought content does not include homicidal or suicidal ideation. Thought content does not include homicidal or suicidal plan.        Cognition and Memory: Cognition and memory normal.        Judgment: Judgment normal.     Comments: OCD better.        Lab Review:     Component Value Date/Time   NA 142 07/26/2017 1534   K 4.6 07/26/2017 1534   CL 100 07/26/2017 1534   CO2 22 07/26/2017 1534   GLUCOSE 109 (H) 07/26/2017 1534   GLUCOSE 99 12/11/2014 1119   BUN 11 07/26/2017 1534   CREATININE 1.02 07/26/2017 1534   CREATININE 0.99 12/11/2014 1119   CALCIUM 10.4 (H) 07/26/2017 1534   PROT 8.4 07/26/2017 1534   ALBUMIN 5.0 (H) 07/26/2017 1534   AST 31 07/26/2017 1534   ALT 31 07/26/2017 1534   ALKPHOS 78 07/26/2017 1534   BILITOT 0.8 07/26/2017 1534   GFRNONAA 80 07/26/2017 1534   GFRNONAA 84 12/11/2014 1119   GFRAA 92 07/26/2017 1534   GFRAA >89 12/11/2014 1119  Component Value Date/Time   WBC 10.4 07/26/2017 1534   WBC 9.9 12/11/2014 1130   RBC 5.61 07/26/2017 1534   RBC 5.69 12/11/2014 1130   HGB 18.0 (H) 07/26/2017 1534   HCT 51.8 (H) 07/26/2017 1534   PLT 296 07/26/2017 1534   MCV 92 07/26/2017 1534    MCH 32.1 07/26/2017 1534   MCH 29.3 12/11/2014 1130   MCHC 34.7 07/26/2017 1534   MCHC 31.7 (A) 12/11/2014 1130   RDW 13.7 07/26/2017 1534   LYMPHSABS 3.1 07/26/2017 1534   EOSABS 0.4 07/26/2017 1534   BASOSABS 0.0 07/26/2017 1534    No results found for: POCLITH, LITHIUM   No results found for: PHENYTOIN, PHENOBARB, VALPROATE, CBMZ   .res Assessment: Plan:    Mixed obsessional thoughts and acts - Plan: cariprazine (VRAYLAR) capsule, sertraline (ZOLOFT) 100 MG tablet  Severe episode of recurrent major depressive disorder, without psychotic features (HCC) - Plan: cariprazine (VRAYLAR) capsule  Depression, major, recurrent, mild (HCC) - Plan: sertraline (ZOLOFT) 100 MG tablet  Insomnia due to mental condition - Plan: LORazepam (ATIVAN) 1 MG tablet, DISCONTINUED: LORazepam (ATIVAN) 1 MG tablet  Rule out excessive sedation fatigue from medication  There is also question whether there may be an underlying bipolar predisposition because of his manic symptoms that he had around December 2018 which were quite dramatic according to his wife.  However it was at a time when he had abruptly discontinued paroxetine.  Greater than 50% of 30 min face to face time with patient was spent on counseling and coordination of care. We discussed the following.  Overall he is reasonably satisfied with his response to the medications now.  It took some time to see improvement but the Vraylar did appear to provide additional benefit.  Consider lamotrigine, lithium, Vraylar, Rexulti.  Consider ECT.  Continue sertraline 200 mg daily.  Due to diarrhea and tiredness, to see if motivation is better and no apparent additional benefit at the higher dosages up to 300 mg daily.  It has helped the OCD but still a lot of general worry.  No med changes today.  Other than okay to restart Viagra   Encourage reading and hobby development for relapse prevention. Try to stay busy bc lack of activity worsens his  OCD.  Consider switch to fluoxetine as the only other FDA approved OCD med he's not taken. 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 Advised pt to contact office if movement side effects occur.  There is none evident at this time.  Continue the  Lorazepam 2 HS which worked for lseep before.  Continue those as you are now. We discussed the short-term risks associated with benzodiazepines including sedation and increased fall risk among others.  Discussed long-term side effect risk including dependence, potential withdrawal symptoms, and the potential eventual dose-related risk of dementia.  OK return to Viagra prn.  Disc SE  Follow-up in 4 months  Meredith Staggers MD, DFAPA  Please see After Visit Summary for patient specific instructions.  No future appointments.  No orders of the defined types were placed in this encounter.     -------------------------------

## 2019-09-04 ENCOUNTER — Telehealth: Payer: Self-pay | Admitting: Psychiatry

## 2019-09-04 NOTE — Telephone Encounter (Signed)
Wife, Mark Estrada, called wanting to discuss with you side effects she is observing in [place].  Please call.

## 2019-09-04 NOTE — Telephone Encounter (Signed)
If restlessness or fidgetiness is bothersome to him he needs to come for another appt to discuss it bc the remedy is to reduce Vraylar and that is a risk for his mood.  I just saw him and he didn't mention it as a concern.

## 2019-09-07 NOTE — Telephone Encounter (Signed)
LM to call back about message from last week.

## 2019-09-09 NOTE — Telephone Encounter (Signed)
Mark Estrada, wife called back and advised if symptoms bothersome to Mark Estrada to set up an apt. She said she would let Mark Estrada know. She did also mention he's not been showering but this has also been an ongoing thing.  She also said to contact her cell phone 1st especially when she's contacting the doctor. Patient gets a bit paranoid and anxious easily. They rarely use the home # listed. Cell phones are easier to contact them.    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 Another stressor for patient. Wife feels he doesn't need a change in medication currently that overall he's better then he has been and doesn't want to trigger anything negative.

## 2019-12-31 ENCOUNTER — Encounter: Payer: Self-pay | Admitting: Psychiatry

## 2019-12-31 ENCOUNTER — Other Ambulatory Visit: Payer: Self-pay

## 2019-12-31 ENCOUNTER — Ambulatory Visit (INDEPENDENT_AMBULATORY_CARE_PROVIDER_SITE_OTHER): Payer: BC Managed Care – PPO | Admitting: Psychiatry

## 2019-12-31 DIAGNOSIS — N522 Drug-induced erectile dysfunction: Secondary | ICD-10-CM | POA: Diagnosis not present

## 2019-12-31 DIAGNOSIS — F422 Mixed obsessional thoughts and acts: Secondary | ICD-10-CM | POA: Diagnosis not present

## 2019-12-31 DIAGNOSIS — F330 Major depressive disorder, recurrent, mild: Secondary | ICD-10-CM | POA: Diagnosis not present

## 2019-12-31 DIAGNOSIS — F5105 Insomnia due to other mental disorder: Secondary | ICD-10-CM

## 2019-12-31 NOTE — Progress Notes (Signed)
Mark Estrada [id number] 1956-02-18 64 y.o.  Subjective:   Patient ID:  Mark Estrada is a 64 y.o. (DOB [birth year]) male.  Chief Complaint:  Chief Complaint  Patient presents with   Follow-up   Depression   Anxiety    HPI: Mark Estrada presents to the office today for follow-up of unmanaged depression and OCD.  He has been fairly unstable for several months.  And at visit May 21, 2018 we increased sertraline from 150 to 200 mg a day  And then 250 about a month ago.  wean off the clomipramine since it did not seem necessary to continue both.  in April when it was recommended he increase sertraline to 300 mg and Abilify to 10 mg daily.  When seen September 2020.  The following adjustments were made: Continue sertraline  300 mg (1 in AM and 2 HS).  Discussed the higher than usual dosage and studies that support this.  It has helped the OCD but still a lot of general worry. Stop Abilify to see if it's causing fatigue. If depression or anxiety worsens, then start samples of Rexulti 1 mg for 1 week, then 2 mg daily.  He never took Rexulti.    W wants him to get a 2nd opinion.  Disc this.  Her concern is he is not himself, gloomy and low energy and motivation.  At his appointment in September he was encouraged again with his wife's presents to get a second opinion about his treatment.  seen February 09, 2019. Energy no better off Abilify and gradually depression and anxiety got worse.  Mind racing at night interferes with sleep and gets 2-3 hours of sleep.  Repeated awakenings.  Continued obsessions and depression. Since the increase in sertraline to 300 mg had not been additionally helpful it was reduced to 250 mg daily.  He was started off label Vraylar 1.5 mg daily because of some mood benefit from the Abilify but insufficient effects.  seen Feb 26, 2019.  Sertraline was reduced to 200 mg daily due to diarrhea and tiredness and to see if motivation was improved  and because of no apparent additional benefit at 300 mg daily.  He was continued on Vraylar 1.5 mg daily which he's now been on almost 6 weeks and lorazepam 2 mg nightly . A little more energy with the Vraylar.  Mild improvement in home activity level.  Wife hasn't said if she notices.  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  Hopes to do it the fullly allowed 3 1/2 days/week.  Loose  BM , no cramps once daily.  Sleep usually ok with lorazepam.  Better with hygiene.  Last couple of years periods of mania with excessive spending and hyperverbal speech.  He never spends money.  This happened when he retired.  Talked pressured and lasted a month or more.  She thinks it happened 2 years ago after  [place] stopping some meds.  seen April 01, 2019.  He was continued on sertraline 200 mg daily and Vraylar 1.5 mg for a longer trial of the Vraylar.   seen March 2021 with the following noted: No meds were changed. Thinks Vraylar is helping some.  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  OCD not bothering him that much.  Mood is a little better with Vraylar.  No SE.  Sleep pretty good and normal hours.  Not a lot of things to enjoy.  Never really had hobbies.  08/27/2019 appointment with the following noted: About the same with maybe slight improvement.  Still a little more anxiety than depression.  OCD is manageable.  Still feels the Leafy KindleVraylar is helpful.  No SE problems. 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 Mental health is pretty good.  Leafy KindleVraylar is helping with anxiety and mood stability.  Handles stress better without flying off the handle.  No SE with it. Still some diarrhea but manageable.. Sleep 6-7 hours with Ativan 2 mg.  Drowsy daytime. OCD is pretty good overall with minimal sx except obs over finances. D with assumed TBI costing money for rehab.  Took her out of  school for the semester.  Fleet ContrasRachel also has a developmental delay so it's difficult to evaluate.  School is really hard for her.  Attends [employer].  Likes watching old TV shows.  Enjoys sub teaching.  Wife says he needs friends.   Pt reports that mood is flat and less anxious and obsessive.  Pt reports no sleep issues and 7 hours with Ativan 2 mg. Pt reports that appetite is good. Pt reports that energy is is a lot better and less anhedonia, loss of interest  poor motivation and more active. Concentration is better. Suicidal thoughts:  denied by patient and occ death thoughts. Also some cognitive problems with mistakes and oversights that are unusual for him.  Less obsessing on health and more on money/job.   Never had sleep study. Snores loudly. Drowsy daytime. No HA.  Doesn't fall asleep driving but can fall asleep at work if it's quiet.  Fatigue.  Family says they suspect OSA bc loud snoring and stops breathing. Wakes himself up snoring.  Other psych med trials include: Clomipramine helped the depression but not the OCD,  Paroxetine 80,  Sertraline 300  Sertraline started early December 2019 Fluvoxamine (both of which eventually failed even at high dosage)  Olanzapine,  Quetiapine,  Aripiprazole,  Risperidone 1 mg Dec 2018 SE loopy  Never took Rexulti Vraylar 1.5 mg daily helped for sleep trazodone 50 which failed and temazepam and zolpidem which caused amnesia    Review of Systems:  Review of Systems  Cardiovascular: Negative for chest pain and palpitations.  Gastrointestinal: Positive for diarrhea. Negative for abdominal distention, abdominal pain, constipation and nausea.       Heartburn resolved  Neurological: Negative for tremors and weakness.  Psychiatric/Behavioral: Positive for dysphoric mood. Negative for agitation, behavioral problems, confusion, decreased concentration, hallucinations, self-injury, sleep disturbance and suicidal ideas. The patient is nervous/anxious. The  patient is not hyperactive.     Medications: I have reviewed the patient's current medications.  Current Outpatient Medications  Medication Sig Dispense Refill   cariprazine (VRAYLAR) capsule Take 1 capsule (1.5 mg total) by mouth daily. 30 capsule 5   LORazepam (ATIVAN) 1 MG tablet Take 2 tablets (2 mg total) by mouth at bedtime. 60 tablet 5   propranolol ER (INDERAL LA) 120 MG 24 hr capsule TK 1 C PO D     sertraline (ZOLOFT) 100 MG tablet Take 2 tablets (200 mg total) by mouth daily. 180 tablet 1   sildenafil (VIAGRA) 100 MG tablet Take 1 tablet (100 mg total) by mouth daily as needed for erectile dysfunction. 30 tablet 0   No current facility-administered medications for this visit.    Medication Side Effects: Other: GI issues.  Miralax helped. diarrhea  Allergies:  Allergies  Allergen Reactions   Erythromycin     Itching and hives     Past Medical History:  Diagnosis Date   Anxiety    Depression    OCD (obsessive compulsive disorder) 1998    Family History  Problem Relation Age of Onset   Heart disease Father    Hyperlipidemia Brother     Social History   Socioeconomic History   Marital status: Married    Spouse name: [name]   Number of children: 1   Years of education: Master's   Highest education level: Not on file  Occupational History   Occupation: History Teacher    Comment: Psychiatrist  Tobacco Use   Smoking status: Never Smoker   Smokeless tobacco: Never Used  Substance and Sexual Activity   Alcohol use: No   Drug use: No   Sexual activity: Not on file  Other Topics Concern   Not on file  Social History Narrative   Lives with his wife and their daughter.   Social Determinants of Health   Financial Resource Strain:    Difficulty of Paying Living Expenses: Not on file  Food Insecurity:    Worried About Programme researcher, broadcasting/film/video in the Last Year: Not on file   [employer] of Food in the Last Year: Not on file   Transportation Needs:    Lack of Transportation (Medical): Not on file   Lack of Transportation (Non-Medical): Not on file  Physical Activity:    Days of Exercise per Week: Not on file   Minutes of Exercise per Session: Not on file  Stress:    Feeling of Stress : Not on file  Social Connections:    Frequency of Communication with Friends and Family: Not on file   Frequency of Social Gatherings with Friends and Family: Not on file   Attends Religious Services: Not on file   Active Member of Clubs or Organizations: Not on file   Attends Banker Meetings: Not on file   Marital Status: Not on file  Intimate Partner Violence:    Fear of Current or Ex-Partner: Not on file   Emotionally Abused: Not on file   Physically Abused: Not on file   Sexually Abused: Not on file    Past Medical History, Surgical history, Social history, and Family history were reviewed and updated as appropriate.   Please see review of systems for further details on the patient's review from today.   Objective:   Physical Exam:  There were no vitals taken for this visit.  Physical Exam Constitutional:      General: He is not in acute distress.    Appearance: He is well-developed.  Musculoskeletal:        General: No deformity.  Neurological:     Mental Status: He is alert and oriented to person, place, and time.     Cranial Nerves: No dysarthria.     Motor: No tremor.     Coordination: Coordination normal.  Psychiatric:        Attention and Perception: Attention and perception normal. He does not perceive auditory or visual hallucinations.        Mood and Affect: Mood is depressed. Mood is not anxious. Affect is not labile, blunt, angry or inappropriate.        Speech: Speech normal. Speech is not slurred.        Behavior: Behavior normal. Behavior is cooperative.  Thought Content: Thought content is not paranoid or delusional. Thought content does not include  homicidal or suicidal ideation. Thought content does not include homicidal or suicidal plan.        Cognition and Memory: Cognition and memory normal.        Judgment: Judgment normal.     Comments: OCD better.   Mild depression     Lab Review:     Component Value Date/Time   NA 142 07/26/2017 1534   K 4.6 07/26/2017 1534   CL 100 07/26/2017 1534   CO2 22 07/26/2017 1534   GLUCOSE 109 (H) 07/26/2017 1534   GLUCOSE 99 12/11/2014 1119   BUN 11 07/26/2017 1534   CREATININE 1.02 07/26/2017 1534   CREATININE 0.99 12/11/2014 1119   CALCIUM 10.4 (H) 07/26/2017 1534   PROT 8.4 07/26/2017 1534   ALBUMIN 5.0 (H) 07/26/2017 1534   AST 31 07/26/2017 1534   ALT 31 07/26/2017 1534   ALKPHOS 78 07/26/2017 1534   BILITOT 0.8 07/26/2017 1534   GFRNONAA 80 07/26/2017 1534   GFRNONAA 84 12/11/2014 1119   GFRAA 92 07/26/2017 1534   GFRAA >89 12/11/2014 1119       Component Value Date/Time   WBC 10.4 07/26/2017 1534   WBC 9.9 12/11/2014 1130   RBC 5.61 07/26/2017 1534   RBC 5.69 12/11/2014 1130   HGB 18.0 (H) 07/26/2017 1534   HCT 51.8 (H) 07/26/2017 1534   PLT 296 07/26/2017 1534   MCV 92 07/26/2017 1534   MCH 32.1 07/26/2017 1534   MCH 29.3 12/11/2014 1130   MCHC 34.7 07/26/2017 1534   MCHC 31.7 (A) 12/11/2014 1130   RDW 13.7 07/26/2017 1534   LYMPHSABS 3.1 07/26/2017 1534   EOSABS 0.4 07/26/2017 1534   BASOSABS 0.0 07/26/2017 1534    No results found for: POCLITH, LITHIUM   No results found for: PHENYTOIN, PHENOBARB, VALPROATE, CBMZ   .res Assessment: Plan:    Mixed obsessional thoughts and acts  Depression, major, recurrent, mild (HCC)  Insomnia due to mental condition  Drug-induced erectile dysfunction  Rule out excessive sedation fatigue from medication  There is also question whether there may be an underlying bipolar predisposition because of his manic symptoms that he had around December 2018 which were quite dramatic according to his wife.  However it was at  a time when he had abruptly discontinued paroxetine.  Greater than 50% of 30 min face to face time with patient was spent on counseling and coordination of care. We discussed the following.  Overall he is reasonably satisfied with his response to the medications now.  It took some time to see improvement but the Vraylar did appear to provide additional benefit.  Consider lamotrigine, lithium, Vraylar, Rexulti.  Consider ECT.  Continue sertraline 200 mg daily. It was reduced Due to diarrhea and tiredness, to see if motivation is better and no apparent additional benefit at the higher dosages up to 300 mg daily.  It has helped the OCD but still a lot of general worry.  No med changes today.    Encourage reading and hobby development for relapse prevention. Try to stay busy bc lack of activity worsens his OCD.  Discussed potential metabolic side effects associated with atypical antipsychotics, as well as potential risk for movement side effects. Advised pt to contact office if movement side effects occur.  There is none evident at this time.  Continue the  Lorazepam 2 HS which worked for lseep before.  Continue those as  you are now. We discussed the short-term risks associated with benzodiazepines including sedation and increased fall risk among others.  Discussed long-term side effect risk including dependence, potential withdrawal symptoms, and the potential eventual dose-related risk of dementia.  Use LED BZ for sleep  OK return to Viagra prn.  Disc SE  Follow-up in 4 months  Meredith Staggers MD, DFAPA  Please see After Visit Summary for patient specific instructions.  Future Appointments  Date Time Provider Department Center  03/31/2020  4:15 PM Cottle, Steva Ready., MD [facility] None    No orders of the defined types were placed in this encounter.     -------------------------------

## 2020-01-01 ENCOUNTER — Telehealth: Payer: Self-pay | Admitting: Psychiatry

## 2020-01-01 NOTE — Telephone Encounter (Signed)
Referral with documentation has been faxed to [facility] for a home sleep study.

## 2020-01-04 NOTE — Telephone Encounter (Signed)
Reviewed

## 2020-02-08 ENCOUNTER — Telehealth: Payer: Self-pay | Admitting: Psychiatry

## 2020-02-08 NOTE — Telephone Encounter (Signed)
Telephone call to patient to give him results of sleep study which was rated as severe sleep apnea with an AHI score of 38.3.  There was evidence of significant oxygen desaturation at night of 22 minutes.  He is likely to greatly benefit from CPAP treatment.  Gave him this information and asked him to call us back and let us know if he would prefer [facility] or some other medical equipment company.  Also call us back if he has any other questions.

## 2020-02-09 NOTE — Telephone Encounter (Signed)
Error

## 2020-02-16 IMAGING — DX DG HIP (WITH OR WITHOUT PELVIS) 2-3V*R*
3 series · 3 of 3 positions shown · non-contrast
Comparison: None.

CLINICAL DATA: Right hip pain.  No reported injury.

EXAM:
DG HIP (WITH OR WITHOUT PELVIS) 2-3V RIGHT

[pelvis ap]
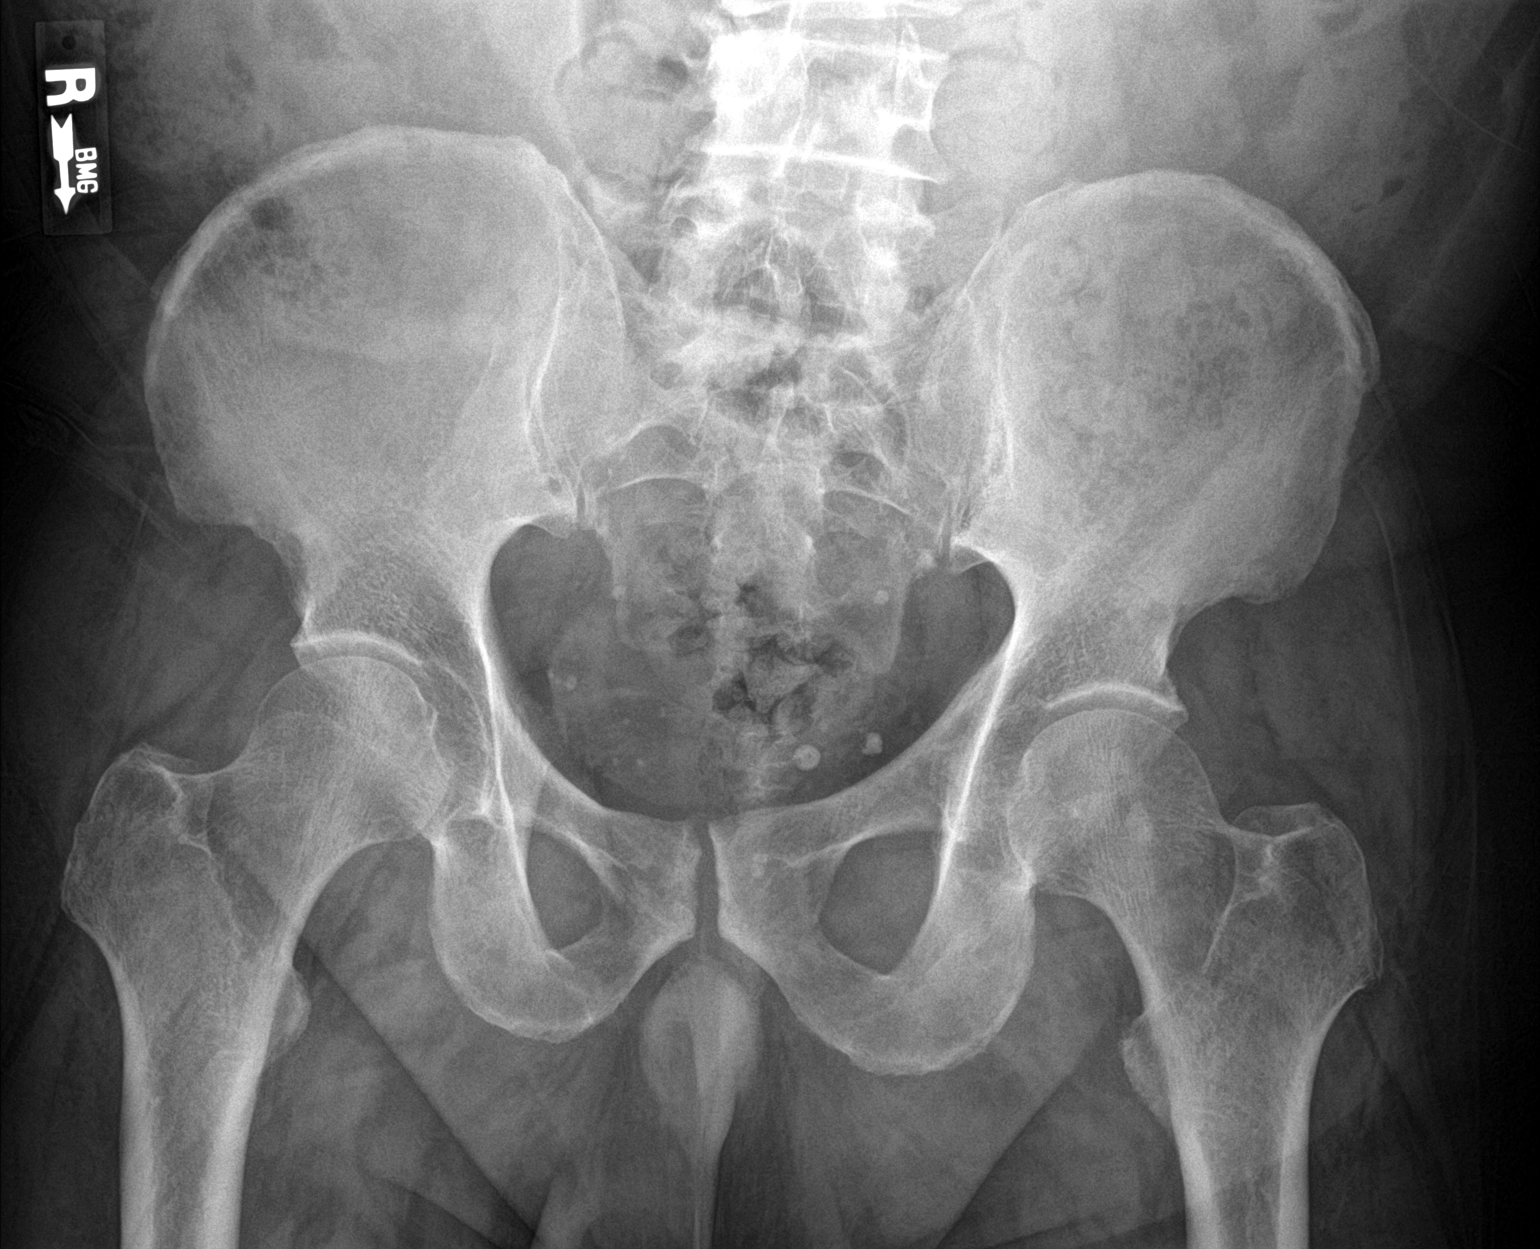

[hip ap]
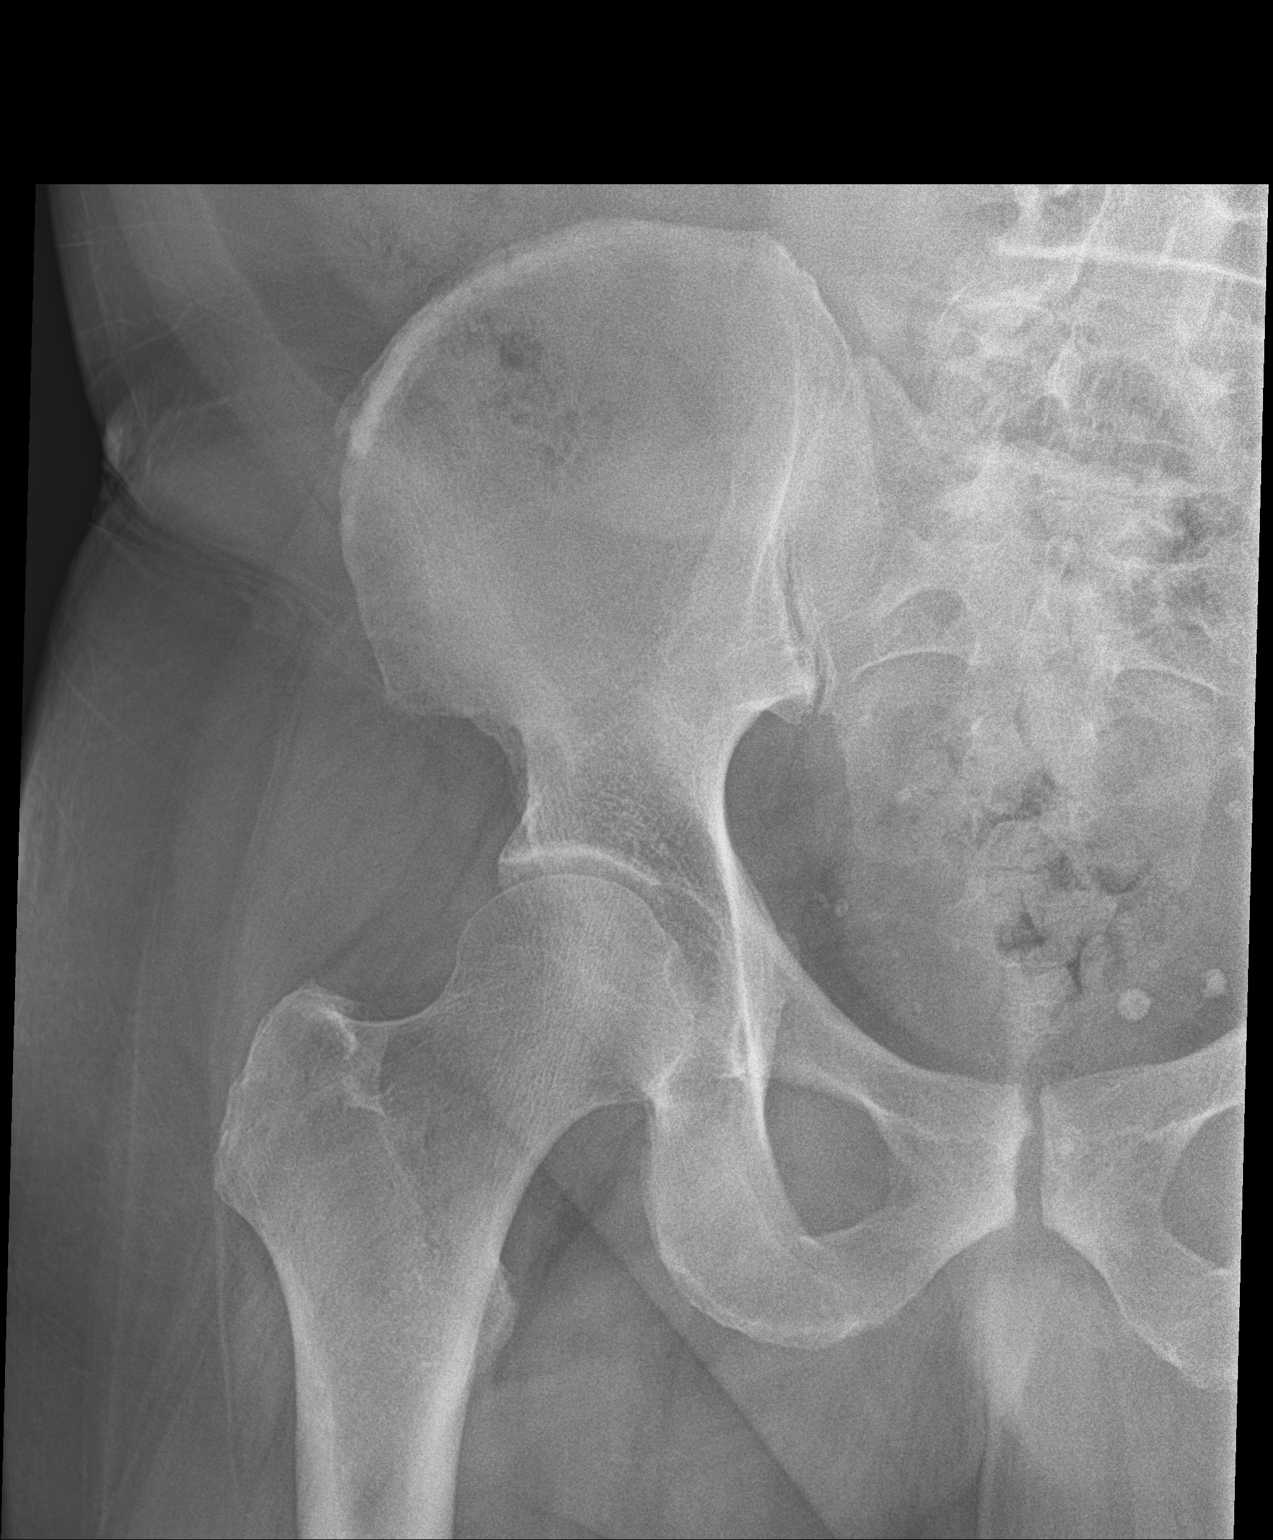

[hip lat]
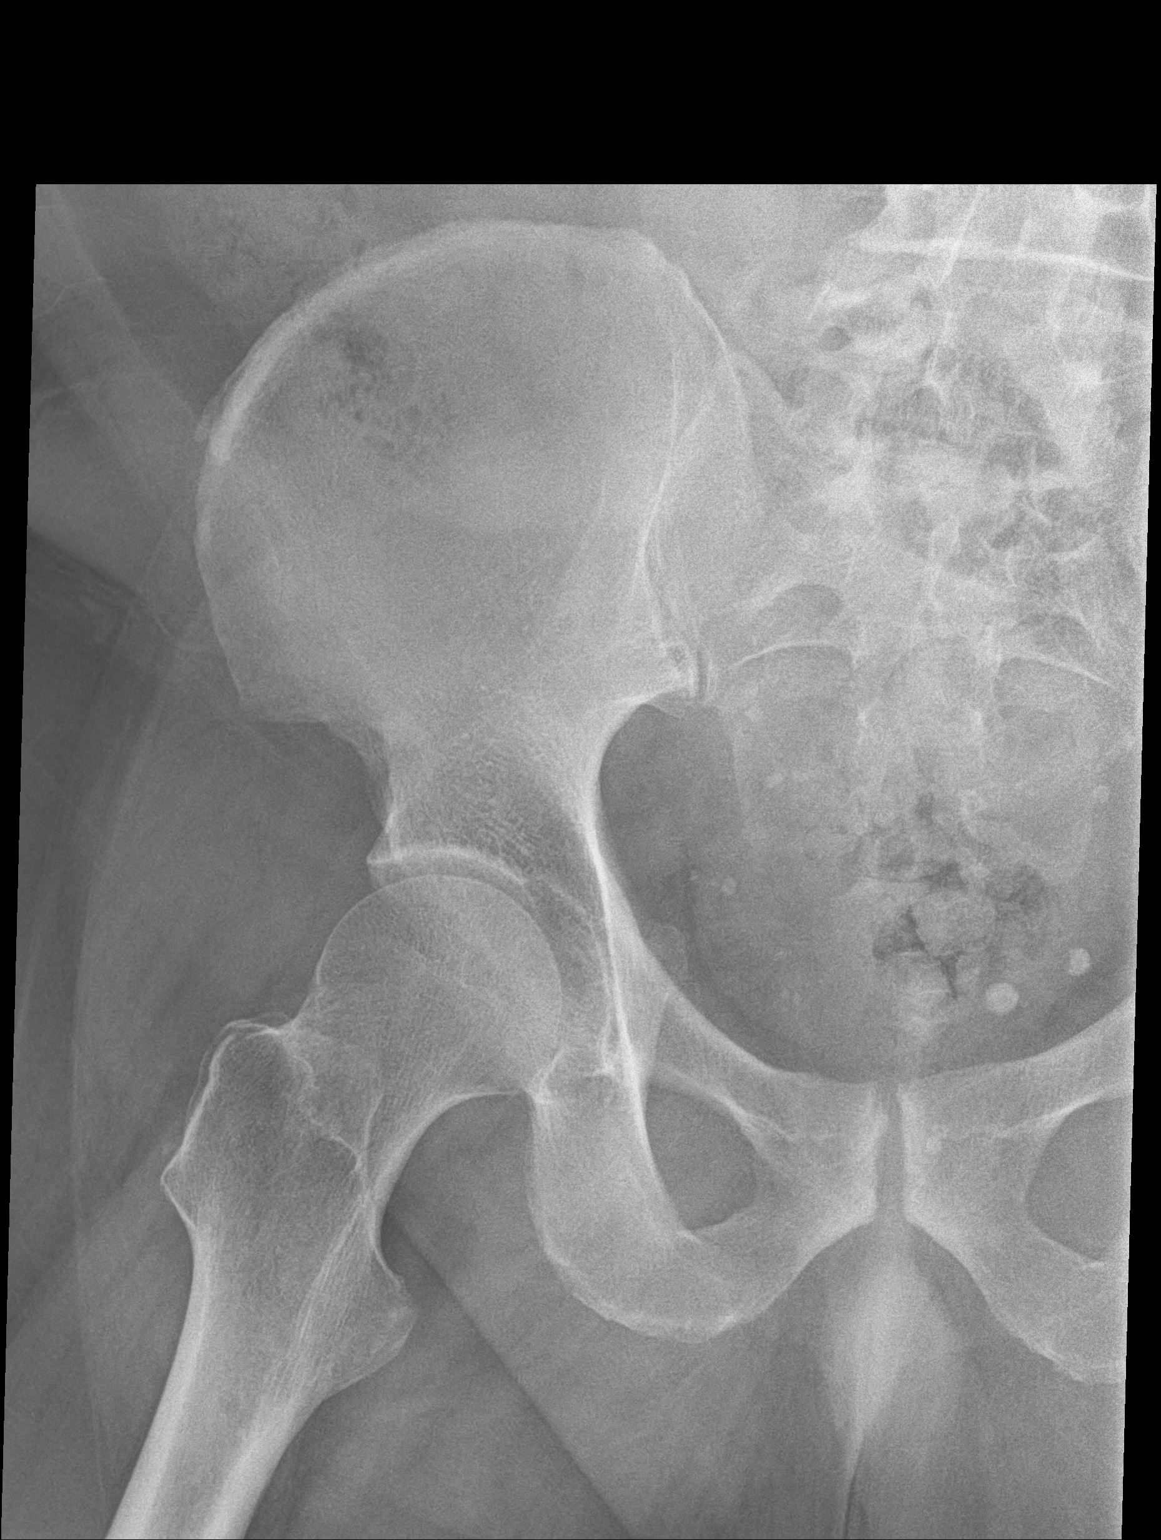

[3 of 3 positions shown; findings below may reference images not displayed]

FINDINGS: No pelvic fracture or diastasis. No right hip fracture or
dislocation. No right hip arthropathy. No suspicious focal osseous
lesions. Degenerative changes in the visualized lower lumbar spine.
No radiopaque foreign body.
IMPRESSION: No fracture. No right hip arthropathy or malalignment. Degenerative
changes in the visualized lower lumbar spine.

## 2020-02-26 NOTE — Telephone Encounter (Signed)
Jola Babinski did patient call back and confirm? If not I will reach out to him again.

## 2020-02-29 ENCOUNTER — Other Ambulatory Visit: Payer: Self-pay | Admitting: Psychiatry

## 2020-02-29 DIAGNOSIS — F5105 Insomnia due to other mental disorder: Secondary | ICD-10-CM

## 2020-02-29 NOTE — Telephone Encounter (Signed)
Reviewed thank you 

## 2020-03-01 ENCOUNTER — Other Ambulatory Visit: Payer: Self-pay | Admitting: Psychiatry

## 2020-03-01 DIAGNOSIS — F332 Major depressive disorder, recurrent severe without psychotic features: Secondary | ICD-10-CM

## 2020-03-01 DIAGNOSIS — F422 Mixed obsessional thoughts and acts: Secondary | ICD-10-CM

## 2020-03-31 ENCOUNTER — Other Ambulatory Visit: Payer: Self-pay | Admitting: Psychiatry

## 2020-03-31 ENCOUNTER — Ambulatory Visit: Payer: BC Managed Care – PPO | Admitting: Psychiatry

## 2020-03-31 DIAGNOSIS — F330 Major depressive disorder, recurrent, mild: Secondary | ICD-10-CM

## 2020-03-31 DIAGNOSIS — F5105 Insomnia due to other mental disorder: Secondary | ICD-10-CM

## 2020-03-31 DIAGNOSIS — F422 Mixed obsessional thoughts and acts: Secondary | ICD-10-CM

## 2020-05-02 ENCOUNTER — Other Ambulatory Visit: Payer: Self-pay | Admitting: Psychiatry

## 2020-05-02 DIAGNOSIS — F5105 Insomnia due to other mental disorder: Secondary | ICD-10-CM

## 2020-05-03 ENCOUNTER — Other Ambulatory Visit: Payer: Self-pay | Admitting: Psychiatry

## 2020-05-03 DIAGNOSIS — F5105 Insomnia due to other mental disorder: Secondary | ICD-10-CM

## 2020-05-18 ENCOUNTER — Ambulatory Visit (INDEPENDENT_AMBULATORY_CARE_PROVIDER_SITE_OTHER): Payer: BC Managed Care – PPO | Admitting: Psychiatry

## 2020-05-18 ENCOUNTER — Other Ambulatory Visit: Payer: Self-pay

## 2020-05-18 ENCOUNTER — Encounter: Payer: Self-pay | Admitting: Psychiatry

## 2020-05-18 DIAGNOSIS — G4733 Obstructive sleep apnea (adult) (pediatric): Secondary | ICD-10-CM

## 2020-05-18 DIAGNOSIS — F5105 Insomnia due to other mental disorder: Secondary | ICD-10-CM | POA: Diagnosis not present

## 2020-05-18 DIAGNOSIS — F422 Mixed obsessional thoughts and acts: Secondary | ICD-10-CM | POA: Diagnosis not present

## 2020-05-18 DIAGNOSIS — F330 Major depressive disorder, recurrent, mild: Secondary | ICD-10-CM

## 2020-05-18 MED ORDER — CARIPRAZINE HCL 1.5 MG PO CAPS: 1.5000 mg | ORAL_CAPSULE | Freq: Every day | ORAL | 2 refills | Status: DC

## 2020-05-18 MED ORDER — SERTRALINE HCL 100 MG PO TABS: ORAL_TABLET | ORAL | 0 refills | Status: DC

## 2020-05-18 MED ORDER — LORAZEPAM 1 MG PO TABS: 2.0000 mg | ORAL_TABLET | Freq: Every day | ORAL | 3 refills | Status: DC

## 2020-05-18 NOTE — Progress Notes (Signed)
Mark GirtLawrence Yerger [id number] 05/12/1955 65 y.o.  Subjective:   Patient ID:  Mark Estrada is a 65 y.o. (DOB 02/04/1956) male.  Chief Complaint:  Chief Complaint  Patient presents with  . Follow-up  . Mixed obsessional thoughts and acts  . Depression  . Anxiety    ocd  . Sleeping Problem    HPI: Mark GirtLawrence Maddux presents to the office today for follow-up of unmanaged depression and OCD.  He has been fairly unstable for several months.  And at visit May 21, 2018 we increased sertraline from 150 to 200 mg a day  And then 250 about a month ago.  wean off the clomipramine since it did not seem necessary to continue both.  in April when it was recommended he increase sertraline to 300 mg and Abilify to 10 mg daily.  When seen September 2020.  The following adjustments were made: Continue sertraline  300 mg (1 in AM and 2 HS).  Discussed the higher than usual dosage and studies that support this.  It has helped the OCD but still a lot of general worry. Stop Abilify to see if it's causing fatigue. If depression or anxiety worsens, then start samples of Rexulti 1 mg for 1 week, then 2 mg daily.  He never took Rexulti.    W wants him to get a 2nd opinion.  Disc this.  Her concern is he is not himself, gloomy and low energy and motivation.  At his appointment in September he was encouraged again with his wife's presents to get a second opinion about his treatment.  seen February 09, 2019. Energy no better off Abilify and gradually depression and anxiety got worse.  Mind racing at night interferes with sleep and gets 2-3 hours of sleep.  Repeated awakenings.  Continued obsessions and depression. Since the increase in sertraline to 300 mg had not been additionally helpful it was reduced to 250 mg daily.  He was started off label Vraylar 1.5 mg daily because of some mood benefit from the Abilify but insufficient effects.  seen Feb 26, 2019.  Sertraline was reduced to 200 mg daily  due to diarrhea and tiredness and to see if motivation was improved and because of no apparent additional benefit at 300 mg daily.  He was continued on Vraylar 1.5 mg daily which he's now been on almost 6 weeks and lorazepam 2 mg nightly . A little more energy with the Vraylar.  Mild improvement in home activity level.  Wife hasn't said if she notices.  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  Hopes to do it the fullly allowed 3 1/2 days/week.  Loose  BM , no cramps once daily.  Sleep usually ok with lorazepam.  Better with hygiene.  Last couple of years periods of mania with excessive spending and hyperverbal speech.  He never spends money.  This happened when he retired.  Talked pressured and lasted a month or more.  She thinks it happened 2 years ago after  [place] stopping some meds.  seen April 01, 2019.  He was continued on sertraline 200 mg daily and Vraylar 1.5 mg for a longer trial of the Vraylar.   seen March 2021 with the following noted: No meds were changed. Thinks Vraylar is helping some.  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  OCD not bothering him that much.  Mood is a little better with Vraylar.  No SE.  Sleep pretty good and normal hours.  Not a lot of things to enjoy.  Never really had hobbies.    08/27/2019 appointment with the following noted: About the same with maybe slight improvement.  Still a little more anxiety than depression.  OCD is manageable.  Still feels the Leafy Kindle is helpful.  No SE problems. 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 Mental health is pretty good.  Leafy Kindle is helping with anxiety and mood stability.  Handles stress better without flying off the handle.  No SE with it. Still some diarrhea but manageable.. Sleep 6-7 hours with Ativan 2 mg.  Drowsy daytime. OCD is pretty good overall with minimal sx except obs over  finances. D with assumed TBI costing money for rehab.  Took her out of school for the semester.  Fleet Contras also has a developmental delay so it's difficult to evaluate.  School is really hard for her.  Attends [employer].  Likes watching old TV shows.  Enjoys sub teaching.  Wife says he needs friends.   Pt reports that mood is flat and less anxious and obsessive.  Pt reports no sleep issues and 7 hours with Ativan 2 mg. Pt reports that appetite is good. Pt reports that energy is is a lot better and less anhedonia, loss of interest  poor motivation and more active. Concentration is better. Suicidal thoughts:  denied by patient and occ death thoughts. Also some cognitive problems with mistakes and oversights that are unusual for him. Less obsessing on health and more on money/job.  Plan: no med changes  05/18/2020 appointment with the following noted: "Doing a lot better".  Almost no checking and less .depression and anxiety. Pleased with meds.   Dx OSA  CPAP  is difficult using for 3 weeks.  Nostrils getting stopped up.     Never had sleep study. Snores loudly. Drowsy daytime. No HA.  Doesn't fall asleep driving but can fall asleep at work if it's quiet.  Fatigue.  Family says they suspect OSA bc loud snoring and stops breathing. Wakes himself up snoring.  Other psych med trials include: Clomipramine helped the depression but not the OCD,  Paroxetine 80,  Sertraline 300  Sertraline started early December 2019 Fluvoxamine (both of which eventually failed even at high dosage)  Olanzapine,  Quetiapine,  Aripiprazole,  Risperidone 1 mg Dec 2018 SE loopy  Never took Rexulti Vraylar 1.5 mg daily helped for sleep trazodone 50 which failed and temazepam and zolpidem which caused amnesia    Review of Systems:  Review of Systems  Cardiovascular: Negative for chest pain and palpitations.  Gastrointestinal: Positive for diarrhea. Negative for abdominal distention, abdominal pain, constipation and  nausea.       Heartburn resolved  Neurological: Negative for tremors and weakness.  Psychiatric/Behavioral: Positive for dysphoric mood. Negative for agitation, behavioral problems, confusion, decreased concentration, hallucinations, self-injury, sleep disturbance and suicidal ideas. The patient is nervous/anxious. The patient is not hyperactive.     Medications: I have reviewed the patient's current medications.  Current Outpatient Medications  Medication Sig Dispense Refill  . cariprazine (VRAYLAR) capsule Take 1 capsule (1.5 mg total) by mouth daily. 30 capsule 2  . propranolol ER (INDERAL LA) 120 MG 24 hr capsule 200 mg.    . LORazepam (ATIVAN) 1 MG tablet Take 2 tablets (2 mg total) by mouth at bedtime. 60 tablet 3  . sertraline (ZOLOFT) 100 MG  tablet TAKE 2 TABLETS(200 MG) BY MOUTH DAILY 180 tablet 0  . sildenafil (VIAGRA) 100 MG tablet Take 1 tablet (100 mg total) by mouth daily as needed for erectile dysfunction. (Patient not taking: Reported on 05/18/2020) 30 tablet 0   No current facility-administered medications for this visit.    Medication Side Effects: Other: GI issues.  Miralax helped. diarrhea  Allergies:  Allergies  Allergen Reactions  . Erythromycin     Itching and hives     Past Medical History:  Diagnosis Date  . Anxiety   . Depression   . OCD (obsessive compulsive disorder) 1998    Family History  Problem Relation Age of Onset  . Heart disease Father   . Hyperlipidemia Brother     Social History   Socioeconomic History  . Marital status: Married    Spouse name: [name]  . Number of children: 1  . Years of education: Master's  . Highest education level: Not on file  Occupational History  . Occupation: History Teacher    Comment: Psychiatrist  Tobacco Use  . Smoking status: Never Smoker  . Smokeless tobacco: Never Used  Substance and Sexual Activity  . Alcohol use: No  . Drug use: No  . Sexual activity: Not on file  Other Topics  Concern  . Not on file  Social History Narrative   Lives with his wife and their daughter.   Social Determinants of Health   Financial Resource Strain: Not on file  Food Insecurity: Not on file  Transportation Needs: Not on file  Physical Activity: Not on file  Stress: Not on file  Social Connections: Not on file  Intimate Partner Violence: Not on file    Past Medical History, Surgical history, Social history, and Family history were reviewed and updated as appropriate.   Please see review of systems for further details on the patient's review from today.   Objective:   Physical Exam:  There were no vitals taken for this visit.  Physical Exam Constitutional:      General: He is not in acute distress.    Appearance: He is well-developed.  Musculoskeletal:        General: No deformity.  Neurological:     Mental Status: He is alert and oriented to person, place, and time.     Cranial Nerves: No dysarthria.     Motor: No tremor.     Coordination: Coordination normal.  Psychiatric:        Attention and Perception: Attention and perception normal. He does not perceive auditory or visual hallucinations.        Mood and Affect: Mood is not anxious or depressed. Affect is not labile, blunt, angry or inappropriate.        Speech: Speech normal. Speech is not slurred.        Behavior: Behavior normal. Behavior is cooperative.        Thought Content: Thought content is not paranoid or delusional. Thought content does not include homicidal or suicidal ideation. Thought content does not include homicidal or suicidal plan.        Cognition and Memory: Cognition and memory normal.        Judgment: Judgment normal.     Comments: OCD very well controlled. Minimal depression     Lab Review:     Component Value Date/Time   NA 142 07/26/2017 1534   K 4.6 07/26/2017 1534   CL 100 07/26/2017 1534   CO2 22 07/26/2017 1534  GLUCOSE 109 (H) 07/26/2017 1534   GLUCOSE 99 12/11/2014 1119    BUN 11 07/26/2017 1534   CREATININE 1.02 07/26/2017 1534   CREATININE 0.99 12/11/2014 1119   CALCIUM 10.4 (H) 07/26/2017 1534   PROT 8.4 07/26/2017 1534   ALBUMIN 5.0 (H) 07/26/2017 1534   AST 31 07/26/2017 1534   ALT 31 07/26/2017 1534   ALKPHOS 78 07/26/2017 1534   BILITOT 0.8 07/26/2017 1534   GFRNONAA 80 07/26/2017 1534   GFRNONAA 84 12/11/2014 1119   GFRAA 92 07/26/2017 1534   GFRAA >89 12/11/2014 1119       Component Value Date/Time   WBC 10.4 07/26/2017 1534   WBC 9.9 12/11/2014 1130   RBC 5.61 07/26/2017 1534   RBC 5.69 12/11/2014 1130   HGB 18.0 (H) 07/26/2017 1534   HCT 51.8 (H) 07/26/2017 1534   PLT 296 07/26/2017 1534   MCV 92 07/26/2017 1534   MCH 32.1 07/26/2017 1534   MCH 29.3 12/11/2014 1130   MCHC 34.7 07/26/2017 1534   MCHC 31.7 (A) 12/11/2014 1130   RDW 13.7 07/26/2017 1534   LYMPHSABS 3.1 07/26/2017 1534   EOSABS 0.4 07/26/2017 1534   BASOSABS 0.0 07/26/2017 1534    No results found for: POCLITH, LITHIUM   No results found for: PHENYTOIN, PHENOBARB, VALPROATE, CBMZ   .res Assessment: Plan:    Mixed obsessional thoughts and acts - Plan: cariprazine (VRAYLAR) capsule, sertraline (ZOLOFT) 100 MG tablet  Depression, major, recurrent, mild (HCC) - Plan: cariprazine (VRAYLAR) capsule, sertraline (ZOLOFT) 100 MG tablet  Insomnia due to mental condition - Plan: LORazepam (ATIVAN) 1 MG tablet  Obstructive sleep apnea  Rule out excessive sedation fatigue from medication  There is also question whether there may be an underlying bipolar predisposition because of his manic symptoms that he had around December 2018 which were quite dramatic according to his wife.  However it was at a time when he had abruptly discontinued paroxetine.  Greater than 50% of 30 min face to face time with patient was spent on counseling and coordination of care. We discussed the following.  Extensive discussion Of CPAP.  Use the app.   Increase humidity of CPAP.  Disc  adjusting ramp time. Use Flonase nightly  Overall he is reasonably satisfied with his response to the medications now.  It took some time to see improvement but the Vraylar did appear to provide additional benefit.  Consider lamotrigine, lithium, Vraylar, Rexulti.  Consider ECT.  Continue sertraline 200 mg daily. It was reduced Due to diarrhea and tiredness, to see if motivation is better and no apparent additional benefit at the higher dosages up to 300 mg daily.  It has helped the OCD but still a lot of general worry.  No med changes today.    Encourage reading and hobby development for relapse prevention. Try to stay busy bc lack of activity worsens his OCD.  Discussed potential metabolic side effects associated with atypical antipsychotics, as well as potential risk for movement side effects. Advised pt to contact office if movement side effects occur.  There is none evident at this time.  Continue the  Lorazepam 2 HS which worked for lseep before.  Continue those as you are now. We discussed the short-term risks associated with benzodiazepines including sedation and increased fall risk among others.  Discussed long-term side effect risk including dependence, potential withdrawal symptoms, and the potential eventual dose-related risk of dementia.  Use LED BZ for sleep  OK return to Viagra prn.  Disc SE  Follow-up in 4 months  Meredith Staggers MD, DFAPA  Please see After Visit Summary for patient specific instructions.  No future appointments.  No orders of the defined types were placed in this encounter.     -------------------------------

## 2020-06-01 ENCOUNTER — Telehealth: Payer: Self-pay | Admitting: Psychiatry

## 2020-06-01 NOTE — Telephone Encounter (Signed)
Records for CPAP compliance were faxed to [facility].

## 2020-06-02 NOTE — Telephone Encounter (Signed)
Reviewed thanks! 

## 2020-06-29 ENCOUNTER — Other Ambulatory Visit: Payer: Self-pay | Admitting: Psychiatry

## 2020-06-29 DIAGNOSIS — F330 Major depressive disorder, recurrent, mild: Secondary | ICD-10-CM

## 2020-06-29 DIAGNOSIS — F422 Mixed obsessional thoughts and acts: Secondary | ICD-10-CM

## 2020-06-30 ENCOUNTER — Other Ambulatory Visit: Payer: Self-pay

## 2020-06-30 ENCOUNTER — Telehealth: Payer: Self-pay | Admitting: Psychiatry

## 2020-06-30 DIAGNOSIS — F330 Major depressive disorder, recurrent, mild: Secondary | ICD-10-CM

## 2020-06-30 DIAGNOSIS — F422 Mixed obsessional thoughts and acts: Secondary | ICD-10-CM

## 2020-06-30 MED ORDER — SERTRALINE HCL 100 MG PO TABS: ORAL_TABLET | ORAL | 0 refills | Status: DC

## 2020-06-30 NOTE — Telephone Encounter (Signed)
Pt called to report he has 6 Sertraline left. Called [facility] requesting refill. Stated we denied. Rx on 05/18/20 was for #180 2/d = 3 months. He was not at home so he could not tell me what date on bottle. Will be at home after we are closed. Please call Pharmacy to see how many they filled and what's going on. Advise pt @ [phone].  Apt 5/25

## 2020-06-30 NOTE — Telephone Encounter (Signed)
Rx sent 

## 2020-08-17 ENCOUNTER — Encounter: Payer: Self-pay | Admitting: Psychiatry

## 2020-08-17 ENCOUNTER — Other Ambulatory Visit: Payer: Self-pay

## 2020-08-17 ENCOUNTER — Ambulatory Visit (INDEPENDENT_AMBULATORY_CARE_PROVIDER_SITE_OTHER): Payer: BC Managed Care – PPO | Admitting: Psychiatry

## 2020-08-17 DIAGNOSIS — F422 Mixed obsessional thoughts and acts: Secondary | ICD-10-CM | POA: Diagnosis not present

## 2020-08-17 DIAGNOSIS — F330 Major depressive disorder, recurrent, mild: Secondary | ICD-10-CM

## 2020-08-17 DIAGNOSIS — G4733 Obstructive sleep apnea (adult) (pediatric): Secondary | ICD-10-CM

## 2020-08-17 DIAGNOSIS — F5105 Insomnia due to other mental disorder: Secondary | ICD-10-CM | POA: Diagnosis not present

## 2020-08-17 DIAGNOSIS — N522 Drug-induced erectile dysfunction: Secondary | ICD-10-CM | POA: Diagnosis not present

## 2020-08-17 MED ORDER — LORAZEPAM 1 MG PO TABS: 2.0000 mg | ORAL_TABLET | Freq: Every day | ORAL | 4 refills | Status: DC

## 2020-08-17 MED ORDER — SERTRALINE HCL 100 MG PO TABS
ORAL_TABLET | ORAL | 1 refills | Status: DC
Start: 2020-08-17 — End: 2020-10-04

## 2020-08-17 MED ORDER — CARIPRAZINE HCL 1.5 MG PO CAPS: 1.5000 mg | ORAL_CAPSULE | Freq: Every day | ORAL | 5 refills | Status: DC

## 2020-08-17 NOTE — Progress Notes (Signed)
Mark GirtLawrence Manahan [id number] 02/21/1956 65 y.o.  Subjective:   Patient ID:  Mark Estrada is a 65 y.o. (DOB 04/01/1955) male.  Chief Complaint:  Chief Complaint  Patient presents with  . Mixed obsessional thoughts and acts  . Follow-up  . Depression  . Sleeping Problem    HPI: Mark GirtLawrence Stults presents to the office today for follow-up of unmanaged depression and OCD.  He has been fairly unstable for several months.  And at visit May 21, 2018 we increased sertraline from 150 to 200 mg a day  And then 250 about a month ago.  wean off the clomipramine since it did not seem necessary to continue both.  in April when it was recommended he increase sertraline to 300 mg and Abilify to 10 mg daily.  When seen September 2020.  The following adjustments were made: Continue sertraline  300 mg (1 in AM and 2 HS).  Discussed the higher than usual dosage and studies that support this.  It has helped the OCD but still a lot of general worry. Stop Abilify to see if it's causing fatigue. If depression or anxiety worsens, then start samples of Rexulti 1 mg for 1 week, then 2 mg daily.  He never took Rexulti.    W wants him to get a 2nd opinion.  Disc this.  Her concern is he is not himself, gloomy and low energy and motivation.  At his appointment in September he was encouraged again with his wife's presents to get a second opinion about his treatment.  seen February 09, 2019. Energy no better off Abilify and gradually depression and anxiety got worse.  Mind racing at night interferes with sleep and gets 2-3 hours of sleep.  Repeated awakenings.  Continued obsessions and depression. Since the increase in sertraline to 300 mg had not been additionally helpful it was reduced to 250 mg daily.  He was started off label Vraylar 1.5 mg daily because of some mood benefit from the Abilify but insufficient effects.  seen Feb 26, 2019.  Sertraline was reduced to 200 mg daily due to diarrhea and  tiredness and to see if motivation was improved and because of no apparent additional benefit at 300 mg daily.  He was continued on Vraylar 1.5 mg daily which he's now been on almost 6 weeks and lorazepam 2 mg nightly . A little more energy with the Vraylar.  Mild improvement in home activity level.  Wife hasn't said if she notices.  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  Hopes to do it the fullly allowed 3 1/2 days/week.  Loose  BM , no cramps once daily.  Sleep usually ok with lorazepam.  Better with hygiene.  Last couple of years periods of mania with excessive spending and hyperverbal speech.  He never spends money.  This happened when he retired.  Talked pressured and lasted a month or more.  She thinks it happened 2 years ago after  [place] stopping some meds.  seen April 01, 2019.  He was continued on sertraline 200 mg daily and Vraylar 1.5 mg for a longer trial of the Vraylar.   seen March 2021 with the following noted: No meds were changed. Thinks Vraylar is helping some.  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  OCD not bothering him that much.  Mood is a little better with Vraylar.  No SE.  Sleep pretty good and normal hours.  Not a lot of things to  enjoy.  Never really had hobbies.    08/27/2019 appointment with the following noted: About the same with maybe slight improvement.  Still a little more anxiety than depression.  OCD is manageable.  Still feels the Leafy Kindle is helpful.  No SE problems. 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 Mental health is pretty good.  Leafy Kindle is helping with anxiety and mood stability.  Handles stress better without flying off the handle.  No SE with it. Still some diarrhea but manageable.. Sleep 6-7 hours with Ativan 2 mg.  Drowsy daytime. OCD is pretty good overall with minimal sx except obs over finances. D with  assumed TBI costing money for rehab.  Took her out of school for the semester.  Fleet Contras also has a developmental delay so it's difficult to evaluate.  School is really hard for her.  Attends [employer].  Likes watching old TV shows.  Enjoys sub teaching.  Wife says he needs friends.   Pt reports that mood is flat and less anxious and obsessive.  Pt reports no sleep issues and 7 hours with Ativan 2 mg. Pt reports that appetite is good. Pt reports that energy is is a lot better and less anhedonia, loss of interest  poor motivation and more active. Concentration is better. Suicidal thoughts:  denied by patient and occ death thoughts. Also some cognitive problems with mistakes and oversights that are unusual for him. Less obsessing on health and more on money/job.  Plan: no med changes  05/18/2020 appointment with the following noted: "Doing a lot better".  Almost no checking and less .depression and anxiety. Pleased with meds.   Dx OSA  CPAP  is difficult using for 3 weeks.  Nostrils getting stopped up.   Never had sleep study. Snores loudly. Drowsy daytime. No HA.  Doesn't fall asleep driving but can fall asleep at work if it's quiet.  Fatigue.  Family says they suspect OSA bc loud snoring and stops breathing. Wakes himself up snoring. Plan: No med changes today.    08/17/2020 appointment with the following noted: Still struggling with CPAP bc claustrophobic.  Start off OK but some nights pushes away. Using about 50% of time. Pretty good and steady with depression and OCD.  Not much time spent with OCD.  Little OCD but some general depression at times. Some days when don't want to get out of chair.  Pushes himself to get out.  Does ok as long as has something to do. Stressful time of year. Sleep hours pretty good.  Cat awakens him but goes back to sleep. SE none.  Other psych med trials include: Clomipramine helped the depression but not the OCD,  Paroxetine 80,  Sertraline 300  Sertraline  started early December 2019 Fluvoxamine (both of which eventually failed even at high dosage)  Olanzapine,  Quetiapine,  Aripiprazole,  Risperidone 1 mg Dec 2018 SE loopy  Never took Rexulti Vraylar 1.5 mg daily helped for sleep trazodone 50 which failed and temazepam and zolpidem which caused amnesia    Review of Systems:  Review of Systems  Cardiovascular: Negative for chest pain and palpitations.  Gastrointestinal: Positive for diarrhea. Negative for abdominal distention, abdominal pain, constipation and nausea.       Heartburn resolved  Neurological: Negative for tremors and weakness.  Psychiatric/Behavioral: Positive for dysphoric mood and sleep disturbance. Negative for agitation, behavioral problems,  confusion, decreased concentration, hallucinations, self-injury and suicidal ideas. The patient is nervous/anxious. The patient is not hyperactive.     Medications: I have reviewed the patient's current medications.  Current Outpatient Medications  Medication Sig Dispense Refill  . cariprazine (VRAYLAR) 1.5 MG capsule Take 1 capsule (1.5 mg total) by mouth daily. 30 capsule 5  . LORazepam (ATIVAN) 1 MG tablet Take 2 tablets (2 mg total) by mouth at bedtime. 60 tablet 4  . propranolol ER (INDERAL LA) 120 MG 24 hr capsule 200 mg. (Patient not taking: Reported on 08/17/2020)    . sertraline (ZOLOFT) 100 MG tablet TAKE 2 TABLETS(200 MG) BY MOUTH DAILY 180 tablet 1  . sildenafil (VIAGRA) 100 MG tablet Take 1 tablet (100 mg total) by mouth daily as needed for erectile dysfunction. (Patient not taking: No sig reported) 30 tablet 0   No current facility-administered medications for this visit.    Medication Side Effects: Other: GI issues.  Miralax helped. diarrhea  Allergies:  Allergies  Allergen Reactions  . Erythromycin     Itching and hives     Past Medical History:  Diagnosis Date  . Anxiety   . Depression   . OCD (obsessive compulsive disorder) 1998    Family History   Problem Relation Age of Onset  . Heart disease Father   . Hyperlipidemia Brother     Social History   Socioeconomic History  . Marital status: Married    Spouse name: [name]  . Number of children: 1  . Years of education: Master's  . Highest education level: Not on file  Occupational History  . Occupation: History Teacher    Comment: Psychiatrist  Tobacco Use  . Smoking status: Never Smoker  . Smokeless tobacco: Never Used  Substance and Sexual Activity  . Alcohol use: No  . Drug use: No  . Sexual activity: Not on file  Other Topics Concern  . Not on file  Social History Narrative   Lives with his wife and their daughter.   Social Determinants of Health   Financial Resource Strain: Not on file  Food Insecurity: Not on file  Transportation Needs: Not on file  Physical Activity: Not on file  Stress: Not on file  Social Connections: Not on file  Intimate Partner Violence: Not on file    Past Medical History, Surgical history, Social history, and Family history were reviewed and updated as appropriate.   Please see review of systems for further details on the patient's review from today.   Objective:   Physical Exam:  There were no vitals taken for this visit.  Physical Exam Constitutional:      General: He is not in acute distress.    Appearance: He is well-developed.  Musculoskeletal:        General: No deformity.  Neurological:     Mental Status: He is alert and oriented to person, place, and time.     Cranial Nerves: No dysarthria.     Motor: No tremor.     Coordination: Coordination normal.  Psychiatric:        Attention and Perception: Attention and perception normal. He does not perceive auditory or visual hallucinations.        Mood and Affect: Mood is depressed. Mood is not anxious. Affect is not labile, blunt, angry or inappropriate.        Speech: Speech normal. Speech is not slurred.        Behavior: Behavior normal. Behavior is  cooperative.  Thought Content: Thought content is not paranoid or delusional. Thought content does not include homicidal or suicidal ideation. Thought content does not include homicidal or suicidal plan.        Cognition and Memory: Cognition and memory normal.        Judgment: Judgment normal.     Comments: OCD very well controlled. Minimal depression     Lab Review:     Component Value Date/Time   NA 142 07/26/2017 1534   K 4.6 07/26/2017 1534   CL 100 07/26/2017 1534   CO2 22 07/26/2017 1534   GLUCOSE 109 (H) 07/26/2017 1534   GLUCOSE 99 12/11/2014 1119   BUN 11 07/26/2017 1534   CREATININE 1.02 07/26/2017 1534   CREATININE 0.99 12/11/2014 1119   CALCIUM 10.4 (H) 07/26/2017 1534   PROT 8.4 07/26/2017 1534   ALBUMIN 5.0 (H) 07/26/2017 1534   AST 31 07/26/2017 1534   ALT 31 07/26/2017 1534   ALKPHOS 78 07/26/2017 1534   BILITOT 0.8 07/26/2017 1534   GFRNONAA 80 07/26/2017 1534   GFRNONAA 84 12/11/2014 1119   GFRAA 92 07/26/2017 1534   GFRAA >89 12/11/2014 1119       Component Value Date/Time   WBC 10.4 07/26/2017 1534   WBC 9.9 12/11/2014 1130   RBC 5.61 07/26/2017 1534   RBC 5.69 12/11/2014 1130   HGB 18.0 (H) 07/26/2017 1534   HCT 51.8 (H) 07/26/2017 1534   PLT 296 07/26/2017 1534   MCV 92 07/26/2017 1534   MCH 32.1 07/26/2017 1534   MCH 29.3 12/11/2014 1130   MCHC 34.7 07/26/2017 1534   MCHC 31.7 (A) 12/11/2014 1130   RDW 13.7 07/26/2017 1534   LYMPHSABS 3.1 07/26/2017 1534   EOSABS 0.4 07/26/2017 1534   BASOSABS 0.0 07/26/2017 1534    No results found for: POCLITH, LITHIUM   No results found for: PHENYTOIN, PHENOBARB, VALPROATE, CBMZ   .res Assessment: Plan:    Depression, major, recurrent, mild (HCC) - Plan: cariprazine (VRAYLAR) 1.5 MG capsule, sertraline (ZOLOFT) 100 MG tablet  Mixed obsessional thoughts and acts - Plan: cariprazine (VRAYLAR) 1.5 MG capsule, sertraline (ZOLOFT) 100 MG tablet  Insomnia due to mental condition - Plan:  LORazepam (ATIVAN) 1 MG tablet  Drug-induced erectile dysfunction  Obstructive sleep apnea  Rule out excessive sedation fatigue from medication  There is also question whether there may be an underlying bipolar predisposition because of his manic symptoms that he had around December 2018 which were quite dramatic according to his wife.  However it was at a time when he had abruptly discontinued paroxetine.  Greater than 50% of 30 min face to face time with patient was spent on counseling and coordination of care. We discussed the following.  Mild depression and OCD managed.  Extensive discussion Of CPAP. He's at 50% use and sees some benefit. Use the app.   Disc humidity of CPAP.  Disc adjusting ramp time. Use Flonase nightly  Overall he is reasonably satisfied with his response to the medications now.  It took some time to see improvement but the Vraylar did appear to provide additional benefit.  Consider lamotrigine, lithium, Vraylar, Rexulti.  Consider ECT.  Continue sertraline 200 mg daily. It was reduced Due to diarrhea and tiredness, to see if motivation is better and no apparent additional benefit at the higher dosages up to 300 mg daily.  It has helped the OCD but still a lot of general worry.  No med changes today.    Encourage reading  and hobby development for relapse prevention. Try to stay busy bc lack of activity worsens his OCD.  Discussed potential metabolic side effects associated with atypical antipsychotics, as well as potential risk for movement side effects. Advised pt to contact office if movement side effects occur.  There is none evident at this time.  Continue the  Lorazepam 2 HS which worked for lseep before.  Continue those as you are now. We discussed the short-term risks associated with benzodiazepines including sedation and increased fall risk among others.  Discussed long-term side effect risk including dependence, potential withdrawal symptoms, and the  potential eventual dose-related risk of dementia.  Use LED BZ for sleep  OK return to Viagra prn.  Disc SE  Follow-up in 4 months  Meredith Staggers MD, DFAPA  Please see After Visit Summary for patient specific instructions.  No future appointments.  No orders of the defined types were placed in this encounter.     -------------------------------

## 2020-10-04 ENCOUNTER — Other Ambulatory Visit: Payer: Self-pay | Admitting: Psychiatry

## 2020-10-04 DIAGNOSIS — F422 Mixed obsessional thoughts and acts: Secondary | ICD-10-CM

## 2020-10-04 DIAGNOSIS — F330 Major depressive disorder, recurrent, mild: Secondary | ICD-10-CM

## 2021-01-17 ENCOUNTER — Other Ambulatory Visit: Payer: Self-pay

## 2021-01-17 ENCOUNTER — Ambulatory Visit (INDEPENDENT_AMBULATORY_CARE_PROVIDER_SITE_OTHER): Payer: BC Managed Care – PPO | Admitting: Psychiatry

## 2021-01-17 ENCOUNTER — Encounter: Payer: Self-pay | Admitting: Psychiatry

## 2021-01-17 DIAGNOSIS — F5105 Insomnia due to other mental disorder: Secondary | ICD-10-CM | POA: Diagnosis not present

## 2021-01-17 DIAGNOSIS — G4733 Obstructive sleep apnea (adult) (pediatric): Secondary | ICD-10-CM | POA: Diagnosis not present

## 2021-01-17 DIAGNOSIS — R29898 Other symptoms and signs involving the musculoskeletal system: Secondary | ICD-10-CM

## 2021-01-17 DIAGNOSIS — F422 Mixed obsessional thoughts and acts: Secondary | ICD-10-CM

## 2021-01-17 DIAGNOSIS — F330 Major depressive disorder, recurrent, mild: Secondary | ICD-10-CM | POA: Diagnosis not present

## 2021-01-17 NOTE — Patient Instructions (Addendum)
Increase vitamin D to 5000 Units daily  Reduce Vraylar to 1 capsule every other day  Reduce sertraline 1 and 1/2 tablets daily

## 2021-01-17 NOTE — Progress Notes (Signed)
Mcihael Estrada [id number] [birth year] 65 y.o.  Subjective:   Patient ID:  Mark Estrada is a 65 y.o. (DOB 29-Jun-1955) male.  Chief Complaint:  Chief Complaint  Patient presents with   Follow-up   Depression, major, recurrent, mild    Mixed obsessional thoughts and acts   Fatigue   Other    Progressive muscle weakness    HPI: Mark Estrada presents to the office today for follow-up of unmanaged depression and OCD.  He has been fairly unstable for several months.  And at visit May 21, 2018 we increased sertraline from 150 to 200 mg a day  And then 250 about a month ago.  wean off the clomipramine since it did not seem necessary to continue both.  in April when it was recommended he increase sertraline to 300 mg and Abilify to 10 mg daily.  When seen September 2020.  The following adjustments were made: Continue sertraline  300 mg (1 in AM and 2 HS).  Discussed the higher than usual dosage and studies that support this.  It has helped the OCD but still a lot of general worry. Stop Abilify to see if it's causing fatigue. If depression or anxiety worsens, then start samples of Rexulti 1 mg for 1 week, then 2 mg daily.  He never took Rexulti.    W wants him to get a 2nd opinion.  Disc this.  Her concern is he is not himself, gloomy and low energy and motivation.  At his appointment in September he was encouraged again with his wife's presents to get a second opinion about his treatment.  seen February 09, 2019. Energy no better off Abilify and gradually depression and anxiety got worse.  Mind racing at night interferes with sleep and gets 2-3 hours of sleep.  Repeated awakenings.  Continued obsessions and depression. Since the increase in sertraline to 300 mg had not been additionally helpful it was reduced to 250 mg daily.  He was started off label Vraylar 1.5 mg daily because of some mood benefit from the Abilify but insufficient effects.  seen Feb 26, 2019.   Sertraline was reduced to 200 mg daily due to diarrhea and tiredness and to see if motivation was improved and because of no apparent additional benefit at 300 mg daily.  He was continued on Vraylar 1.5 mg daily which he's now been on almost 6 weeks and lorazepam 2 mg nightly . A little more energy with the Vraylar.  Mild improvement in home activity level.  Wife hasn't said if she notices.  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  Hopes to do it the fullly allowed 3 1/2 days/week.  Loose  BM , no cramps once daily.  Sleep usually ok with lorazepam.  Better with hygiene.  Last couple of years periods of mania with excessive spending and hyperverbal speech.  He never spends money.  This happened when he retired.  Talked pressured and lasted a month or more.  She thinks it happened 2 years ago after  [place] stopping some meds.  seen April 01, 2019.  He was continued on sertraline 200 mg daily and Vraylar 1.5 mg for a longer trial of the Vraylar.   seen March 2021 with the following noted: No meds were changed. Thinks Vraylar is helping some.  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  OCD not bothering him that much.  Mood is a little better with Vraylar.  No SE.  Sleep  pretty good and normal hours.  Not a lot of things to enjoy.  Never really had hobbies.    08/27/2019 appointment with the following noted: About the same with maybe slight improvement.  Still a little more anxiety than depression.  OCD is manageable.  Still feels the Leafy Kindle is helpful.  No SE problems. 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 Mental health is pretty good.  Leafy Kindle is helping with anxiety and mood stability.  Handles stress better without flying off the handle.  No SE with it. Still some diarrhea but manageable.. Sleep 6-7 hours with Ativan 2 mg.  Drowsy daytime. OCD is pretty good  overall with minimal sx except obs over finances. D with assumed TBI costing money for rehab.  Took her out of school for the semester.  Fleet Contras also has a developmental delay so it's difficult to evaluate.  School is really hard for her.  Attends [employer].  Likes watching old TV shows.  Enjoys sub teaching.  Wife says he needs friends.   Pt reports that mood is flat and less anxious and obsessive.  Pt reports no sleep issues and 7 hours with Ativan 2 mg. Pt reports that appetite is good. Pt reports that energy is is a lot better and less anhedonia, loss of interest  poor motivation and more active. Concentration is better. Suicidal thoughts:  denied by patient and occ death thoughts. Also some cognitive problems with mistakes and oversights that are unusual for him. Less obsessing on health and more on money/job.  Plan: no med changes  05/18/2020 appointment with the following noted: "Doing a lot better".  Almost no checking and less .depression and anxiety. Pleased with meds.   Dx OSA  CPAP  is difficult using for 3 weeks.  Nostrils getting stopped up.   Never had sleep study. Snores loudly. Drowsy daytime. No HA.  Doesn't fall asleep driving but can fall asleep at work if it's quiet.  Fatigue.  Family says they suspect OSA bc loud snoring and stops breathing. Wakes himself up snoring. Plan: No med changes today.    08/17/2020 appointment with the following noted: Still struggling with CPAP bc claustrophobic.  Start off OK but some nights pushes away. Using about 50% of time. Pretty good and steady with depression and OCD.  Not much time spent with OCD.  Little OCD but some general depression at times. Some days when don't Estrada to get out of chair.  Pushes himself to get out.  Does ok as long as has something to do. Stressful time of year. Sleep hours pretty good.  Cat awakens him but goes back to sleep. SE none.  01/17/21 appt noted: with wife on phone Weakness arms and legs getting  worse for 2 years.  No pain, cramping, numbness but legs give out and can't hold himself up at times.  Weakness waxes and wanes.   Saw PCP Dr. [name] and referred to neurology. Little physical activity for mos.   Wife notes his memory is slipping and mental processing of things like menus at restaurants. Wife thinks he's stable with OCD and depression but low energy and motivation.  Always been a workaholic and completely change. Not using CPAP. Not showering or brushing teeth properly.  He admits to no motivation.  Other psych med trials include: Clomipramine helped the depression but not the OCD,  Paroxetine 80,  Sertraline 300  Sertraline started early December 2019 Fluvoxamine (both of which eventually failed even at high dosage)  Olanzapine,  Quetiapine,  Aripiprazole,  Risperidone 1 mg Dec 2018 SE loopy  Never took Rexulti Vraylar 1.5 mg daily helped for sleep trazodone 50 which failed and temazepam and zolpidem which caused amnesia, lorazepam 2 mg HS    Review of Systems:  Review of Systems  Cardiovascular:  Negative for chest pain and palpitations.  Gastrointestinal:  Positive for diarrhea. Negative for abdominal distention, abdominal pain, constipation and nausea.       Heartburn resolved  Neurological:  Negative for tremors and weakness.  Psychiatric/Behavioral:  Positive for decreased concentration, dysphoric mood and sleep disturbance. Negative for agitation, behavioral problems, confusion, hallucinations, self-injury and suicidal ideas. The patient is nervous/anxious. The patient is not hyperactive.    Medications: I have reviewed the patient's current medications.  Current Outpatient Medications  Medication Sig Dispense Refill   cariprazine (VRAYLAR) 1.5 MG capsule Take 1 capsule (1.5 mg total) by mouth daily. 30 capsule 5   cyanocobalamin (,VITAMIN B-12,) 1000 MCG/ML injection Inject into the muscle.     Ergocalciferol (VITAMIN D2 PO) Take by mouth.     LORazepam  (ATIVAN) 1 MG tablet Take 2 tablets (2 mg total) by mouth at bedtime. 60 tablet 4   propranolol ER (INDERAL LA) 120 MG 24 hr capsule 120 mg.     sertraline (ZOLOFT) 100 MG tablet TAKE 2 TABLETS(200 MG) BY MOUTH DAILY 180 tablet 1   No current facility-administered medications for this visit.    Medication Side Effects: Other: GI issues.  Miralax helped.  diarrhea  Allergies:  Allergies  Allergen Reactions   Erythromycin     Itching and hives     Past Medical History:  Diagnosis Date   Anxiety    Depression    OCD (obsessive compulsive disorder) 1998    Family History  Problem Relation Age of Onset   Heart disease Father    Hyperlipidemia Brother     Social History   Socioeconomic History   Marital status: Married    Spouse name: [name]   Number of children: 1   Years of education: Master's   Highest education level: Not on file  Occupational History   Occupation: History Teacher    Comment: Psychiatrist  Tobacco Use   Smoking status: Never   Smokeless tobacco: Never  Substance and Sexual Activity   Alcohol use: No   Drug use: No   Sexual activity: Not on file  Other Topics Concern   Not on file  Social History Narrative   Lives with his wife and their daughter.   Social Determinants of Health   Financial Resource Strain: Not on file  Food Insecurity: Not on file  Transportation Needs: Not on file  Physical Activity: Not on file  Stress: Not on file  Social Connections: Not on file  Intimate Partner Violence: Not on file    Past Medical History, Surgical history, Social history, and Family history were reviewed and updated as appropriate.   Please see review of systems for further details on the patient's review from today.   Objective:   Physical Exam:  There were no vitals taken for this visit.  Physical Exam Constitutional:      General: He is not in acute distress.    Appearance: He is well-developed.  Musculoskeletal:         General: No deformity.  Neurological:  Mental Status: He is alert and oriented to person, place, and time.     Cranial Nerves: No dysarthria.     Motor: Weakness present.     Coordination: Coordination abnormal.     Gait: Gait abnormal.     Comments: No increased motor tone  Psychiatric:        Attention and Perception: Attention and perception normal. He does not perceive auditory or visual hallucinations.        Mood and Affect: Mood is depressed. Mood is not anxious. Affect is not labile, blunt, angry or inappropriate.        Speech: Speech normal. Speech is not slurred.        Behavior: Behavior normal. Behavior is cooperative.        Thought Content: Thought content is not paranoid or delusional. Thought content does not include homicidal or suicidal ideation. Thought content does not include homicidal or suicidal plan.        Cognition and Memory: Cognition normal. He exhibits impaired recent memory.        Judgment: Judgment normal.     Comments: OCD very well controlled. Minimal depression    Lab Review:     Component Value Date/Time   NA 142 07/26/2017 1534   K 4.6 07/26/2017 1534   CL 100 07/26/2017 1534   CO2 22 07/26/2017 1534   GLUCOSE 109 (H) 07/26/2017 1534   GLUCOSE 99 12/11/2014 1119   BUN 11 07/26/2017 1534   CREATININE 1.02 07/26/2017 1534   CREATININE 0.99 12/11/2014 1119   CALCIUM 10.4 (H) 07/26/2017 1534   PROT 8.4 07/26/2017 1534   ALBUMIN 5.0 (H) 07/26/2017 1534   AST 31 07/26/2017 1534   ALT 31 07/26/2017 1534   ALKPHOS 78 07/26/2017 1534   BILITOT 0.8 07/26/2017 1534   GFRNONAA 80 07/26/2017 1534   GFRNONAA 84 12/11/2014 1119   GFRAA 92 07/26/2017 1534   GFRAA >89 12/11/2014 1119       Component Value Date/Time   WBC 10.4 07/26/2017 1534   WBC 9.9 12/11/2014 1130   RBC 5.61 07/26/2017 1534   RBC 5.69 12/11/2014 1130   HGB 18.0 (H) 07/26/2017 1534   HCT 51.8 (H) 07/26/2017 1534   PLT 296 07/26/2017 1534   MCV 92 07/26/2017 1534   MCH  32.1 07/26/2017 1534   MCH 29.3 12/11/2014 1130   MCHC 34.7 07/26/2017 1534   MCHC 31.7 (A) 12/11/2014 1130   RDW 13.7 07/26/2017 1534   LYMPHSABS 3.1 07/26/2017 1534   EOSABS 0.4 07/26/2017 1534   BASOSABS 0.0 07/26/2017 1534    No results found for: POCLITH, LITHIUM   No results found for: PHENYTOIN, PHENOBARB, VALPROATE, CBMZ   Low B12 and bioavailable testosterone  .res Assessment: Plan:    Depression, major, recurrent, mild (HCC)  Mixed obsessional thoughts and acts  Insomnia due to mental condition  Obstructive sleep apnea  Weakness of both legs  Weakness of both arms  Rule out excessive sedation fatigue from medication  There is also question whether there may be an underlying bipolar predisposition because of his manic symptoms that he had around December 2018 which were quite dramatic according to his wife.  However it was at a time when he had abruptly discontinued paroxetine.  Greater than 50% of 30 min face to face time with patient was spent on counseling and coordination of care. We discussed the following.  Mild depression and OCD managed.  Extensive discussion Of CPAP. He's at 50% use and  sees some benefit. Use the app.   Disc humidity of CPAP.  Disc adjusting ramp time. Use Flonase nightly  Consider lamotrigine, lithium, Vraylar, Rexulti.    Encourage reading and hobby development for relapse prevention. Try to stay busy bc lack of activity worsens his OCD.  Discussed potential metabolic side effects associated with atypical antipsychotics, as well as potential risk for movement side effects. Advised pt to contact office if movement side effects occur.  There is none evident at this time.  Continue the  Lorazepam 2 HS which worked for lseep before.  Continue those as you are now. We discussed the short-term risks associated with benzodiazepines including sedation and increased fall risk among others.  Discussed long-term side effect risk including  dependence, potential withdrawal symptoms, and the potential eventual dose-related risk of dementia.  Use LED BZ for sleep  Increase vitamin D to 5000 Units daily Disc mental health benefit of vitamin d.  To be sure meds are not contributing to fatigue or lack of motivation. Reduce Vraylar to 1 capsule every other day Reduce sertraline 1 and 1/2 tablets daily  Call if gets more  OK return to Viagra prn.  Disc SE  Follow-up in 2 months  Meredith Staggers MD, DFAPA  Please see After Visit Summary for patient specific instructions.  Future Appointments  Date Time Provider Department Center  03/28/2021 11:00 AM Cottle, Steva Ready., MD [facility] None    No orders of the defined types were placed in this encounter.     -------------------------------

## 2021-01-31 ENCOUNTER — Other Ambulatory Visit: Payer: Self-pay | Admitting: Psychiatry

## 2021-01-31 DIAGNOSIS — F5105 Insomnia due to other mental disorder: Secondary | ICD-10-CM

## 2021-01-31 NOTE — Telephone Encounter (Signed)
Last filled 10/5 appt on 03/28/21

## 2021-03-28 ENCOUNTER — Ambulatory Visit (INDEPENDENT_AMBULATORY_CARE_PROVIDER_SITE_OTHER): Payer: BC Managed Care – PPO | Admitting: Psychiatry

## 2021-03-28 ENCOUNTER — Encounter: Payer: Self-pay | Admitting: Psychiatry

## 2021-03-28 ENCOUNTER — Other Ambulatory Visit: Payer: Self-pay

## 2021-03-28 DIAGNOSIS — G4733 Obstructive sleep apnea (adult) (pediatric): Secondary | ICD-10-CM

## 2021-03-28 DIAGNOSIS — F5105 Insomnia due to other mental disorder: Secondary | ICD-10-CM

## 2021-03-28 DIAGNOSIS — F331 Major depressive disorder, recurrent, moderate: Secondary | ICD-10-CM

## 2021-03-28 DIAGNOSIS — R29898 Other symptoms and signs involving the musculoskeletal system: Secondary | ICD-10-CM

## 2021-03-28 DIAGNOSIS — F422 Mixed obsessional thoughts and acts: Secondary | ICD-10-CM | POA: Diagnosis not present

## 2021-03-28 NOTE — Progress Notes (Signed)
Mark GirtLawrence Higashi [id number] 07/17/1955 66 y.o.  Subjective:   Patient ID:  Mark Estrada is a 66 y.o. (DOB 10/01/1955) male.  Chief Complaint:  Chief Complaint  Patient presents with   Follow-up    Mixed obsessional thoughts and acts   Depression   Anxiety    HPI: Mark GirtLawrence Herrig presents to the office today for follow-up of unmanaged depression and OCD.  He has been fairly unstable for several months.  And at visit May 21, 2018 we increased sertraline from 150 to 200 mg a day  And then 250 about a month ago.  wean off the clomipramine since it did not seem necessary to continue both.  in April when it was recommended he increase sertraline to 300 mg and Abilify to 10 mg daily.  When seen September 2020.  The following adjustments were made: Continue sertraline  300 mg (1 in AM and 2 HS).  Discussed the higher than usual dosage and studies that support this.  It has helped the OCD but still a lot of general worry. Stop Abilify to see if it's causing fatigue. If depression or anxiety worsens, then start samples of Rexulti 1 mg for 1 week, then 2 mg daily.  He never took Rexulti.    W wants him to get a 2nd opinion.  Disc this.  Her concern is he is not himself, gloomy and low energy and motivation.  At his appointment in September he was encouraged again with his wife's presents to get a second opinion about his treatment.  seen February 09, 2019. Energy no better off Abilify and gradually depression and anxiety got worse.  Mind racing at night interferes with sleep and gets 2-3 hours of sleep.  Repeated awakenings.  Continued obsessions and depression. Since the increase in sertraline to 300 mg had not been additionally helpful it was reduced to 250 mg daily.  He was started off label Vraylar 1.5 mg daily because of some mood benefit from the Abilify but insufficient effects.  seen Feb 26, 2019.  Sertraline was reduced to 200 mg daily due to diarrhea and tiredness and  to see if motivation was improved and because of no apparent additional benefit at 300 mg daily.  He was continued on Vraylar 1.5 mg daily which he's now been on almost 6 weeks and lorazepam 2 mg nightly . A little more energy with the Vraylar.  Mild improvement in home activity level.  Wife hasn't said if she notices.  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  Hopes to do it the fullly allowed 3 1/2 days/week.  Loose  BM , no cramps once daily.  Sleep usually ok with lorazepam.  Better with hygiene.  Last couple of years periods of mania with excessive spending and hyperverbal speech.  He never spends money.  This happened when he retired.  Talked pressured and lasted a month or more.  She thinks it happened 2 years ago after  [place] stopping some meds.  seen April 01, 2019.  He was continued on sertraline 200 mg daily and Vraylar 1.5 mg for a longer trial of the Vraylar.   seen March 2021 with the following noted: No meds were changed. Thinks Vraylar is helping some.  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  OCD not bothering him that much.  Mood is a little better with Vraylar.  No SE.  Sleep pretty good and normal hours.  Not a lot of things to  enjoy.  Never really had hobbies.    08/27/2019 appointment with the following noted: About the same with maybe slight improvement.  Still a little more anxiety than depression.  OCD is manageable.  Still feels the Leafy Kindle is helpful.  No SE problems. 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 Mental health is pretty good.  Leafy Kindle is helping with anxiety and mood stability.  Handles stress better without flying off the handle.  No SE with it. Still some diarrhea but manageable.. Sleep 6-7 hours with Ativan 2 mg.  Drowsy daytime. OCD is pretty good overall with minimal sx except obs over finances. D with assumed TBI costing  money for rehab.  Took her out of school for the semester.  Fleet Contras also has a developmental delay so it's difficult to evaluate.  School is really hard for her.  Attends [employer].  Likes watching old TV shows.  Enjoys sub teaching.  Wife says he needs friends.   Pt reports that mood is flat and less anxious and obsessive.  Pt reports no sleep issues and 7 hours with Ativan 2 mg. Pt reports that appetite is good. Pt reports that energy is is a lot better and less anhedonia, loss of interest  poor motivation and more active. Concentration is better. Suicidal thoughts:  denied by patient and occ death thoughts. Also some cognitive problems with mistakes and oversights that are unusual for him. Less obsessing on health and more on money/job.  Plan: no med changes  05/18/2020 appointment with the following noted: "Doing a lot better".  Almost no checking and less .depression and anxiety. Pleased with meds.   Dx OSA  CPAP  is difficult using for 3 weeks.  Nostrils getting stopped up.   Never had sleep study. Snores loudly. Drowsy daytime. No HA.  Doesn't fall asleep driving but can fall asleep at work if it's quiet.  Fatigue.  Family says they suspect OSA bc loud snoring and stops breathing. Wakes himself up snoring. Plan: No med changes today.    08/17/2020 appointment with the following noted: Still struggling with CPAP bc claustrophobic.  Start off OK but some nights pushes away. Using about 50% of time. Pretty good and steady with depression and OCD.  Not much time spent with OCD.  Little OCD but some general depression at times. Some days when don't want to get out of chair.  Pushes himself to get out.  Does ok as long as has something to do. Stressful time of year. Sleep hours pretty good.  Cat awakens him but goes back to sleep. SE none.  01/17/21 appt noted: with wife on phone Weakness arms and legs getting worse for 2 years.  No pain, cramping, numbness but legs give out and can't hold  himself up at times.  Weakness waxes and wanes.   Saw PCP Dr. [name] and referred to neurology. Little physical activity for mos.   Wife notes his memory is slipping and mental processing of things like menus at restaurants. Wife thinks he's stable with OCD and depression but low energy and motivation.  Always been a workaholic and completely change. Not using CPAP. Not showering or brushing teeth properly.  He admits to no motivation. Plan: To be sure meds are not contributing to fatigue or lack of motivation. Reduce Vraylar to 1 capsule every other day Reduce sertraline 1 and 1/2  tablets daily  03/28/2021 appointment with the following noted: Awaiting neurology appt. Doing PT and is getting a lot better.  Better with walking and endurance.  Legs stronger.   Still struggling with showering as regular as should but is brushing teeth. I wouldn't say depressed but somewhere in the middle. Little things bother him and either get better or worse.  Example wanted to substitute and no jobs evident.   Interest is 5-6/10.  Not doing much for fun except reading a lot and enjoying it.   No problems with less meds so far.   Sleep well with lorazepam.  Other psych med trials include: Clomipramine helped the depression but not the OCD,  Paroxetine 80,  Sertraline 300  Sertraline started early December 2019 Fluvoxamine (both of which eventually failed even at high dosage)  Olanzapine,  Quetiapine,  Aripiprazole,  Risperidone 1 mg Dec 2018 SE loopy  Never took Rexulti Vraylar 1.5 mg daily helped for sleep trazodone 50 which failed and temazepam and zolpidem which caused amnesia, lorazepam 2 mg HS    Review of Systems:  Review of Systems  Cardiovascular:  Negative for chest pain and palpitations.  Gastrointestinal:  Positive for diarrhea. Negative for abdominal distention, abdominal pain, constipation and nausea.       Heartburn resolved  Neurological:  Negative for tremors and weakness.   Psychiatric/Behavioral:  Positive for decreased concentration, dysphoric mood and sleep disturbance. Negative for agitation, behavioral problems, confusion, hallucinations, self-injury and suicidal ideas. The patient is nervous/anxious. The patient is not hyperactive.    Medications: I have reviewed the patient's current medications.  Current Outpatient Medications  Medication Sig Dispense Refill   cariprazine (VRAYLAR) 1.5 MG capsule Take 1 capsule (1.5 mg total) by mouth daily. (Patient taking differently: Take 1.5 mg by mouth daily. Every other day) 30 capsule 5   Ergocalciferol (VITAMIN D2 PO) Take by mouth.     LORazepam (ATIVAN) 1 MG tablet TAKE 2 TABLETS(2 MG) BY MOUTH AT BEDTIME 60 tablet 2   propranolol ER (INDERAL LA) 120 MG 24 hr capsule 120 mg. 1.5 tabs     sertraline (ZOLOFT) 100 MG tablet TAKE 2 TABLETS(200 MG) BY MOUTH DAILY (Patient taking differently: Take 150 mg by mouth daily.) 180 tablet 1   cyanocobalamin (,VITAMIN B-12,) 1000 MCG/ML injection Inject into the muscle. (Patient not taking: Reported on 03/28/2021)     No current facility-administered medications for this visit.    Medication Side Effects: Other: GI issues.  Miralax helped.  diarrhea  Allergies:  Allergies  Allergen Reactions   Erythromycin     Itching and hives     Past Medical History:  Diagnosis Date   Anxiety    Depression    OCD (obsessive compulsive disorder) 1998    Family History  Problem Relation Age of Onset   Heart disease Father    Hyperlipidemia Brother     Social History   Socioeconomic History   Marital status: Married    Spouse name: [name]   Number of children: 1   Years of education: Master's   Highest education level: Not on file  Occupational History   Occupation: History Teacher    Comment: Psychiatrist  Tobacco Use   Smoking status: Never   Smokeless tobacco: Never  Substance and Sexual Activity   Alcohol use: No   Drug use: No   Sexual activity:  Not on file  Other Topics Concern   Not on file  Social History Narrative   Lives  with his wife and their daughter.   Social Determinants of Health   Financial Resource Strain: Not on file  Food Insecurity: Not on file  Transportation Needs: Not on file  Physical Activity: Not on file  Stress: Not on file  Social Connections: Not on file  Intimate Partner Violence: Not on file    Past Medical History, Surgical history, Social history, and Family history were reviewed and updated as appropriate.   Please see review of systems for further details on the patient's review from today.   Objective:   Physical Exam:  There were no vitals taken for this visit.  Physical Exam Constitutional:      General: He is not in acute distress.    Appearance: He is well-developed.  Musculoskeletal:        General: No deformity.  Neurological:     Mental Status: He is alert and oriented to person, place, and time.     Cranial Nerves: No dysarthria.     Motor: Weakness present.     Coordination: Coordination abnormal.     Gait: Gait abnormal.     Comments: Using cane  Psychiatric:        Attention and Perception: Attention and perception normal. He does not perceive auditory or visual hallucinations.        Mood and Affect: Mood is depressed. Mood is not anxious. Affect is not labile, blunt, angry or inappropriate.        Speech: Speech normal. Speech is not slurred.        Behavior: Behavior normal. Behavior is cooperative.        Thought Content: Thought content is not paranoid or delusional. Thought content does not include homicidal or suicidal ideation. Thought content does not include homicidal or suicidal plan.        Cognition and Memory: Cognition normal. He exhibits impaired recent memory.        Judgment: Judgment normal.     Comments: OCD very well controlled. Minimal depression    Lab Review:     Component Value Date/Time   NA 142 07/26/2017 1534   K 4.6 07/26/2017 1534    CL 100 07/26/2017 1534   CO2 22 07/26/2017 1534   GLUCOSE 109 (H) 07/26/2017 1534   GLUCOSE 99 12/11/2014 1119   BUN 11 07/26/2017 1534   CREATININE 1.02 07/26/2017 1534   CREATININE 0.99 12/11/2014 1119   CALCIUM 10.4 (H) 07/26/2017 1534   PROT 8.4 07/26/2017 1534   ALBUMIN 5.0 (H) 07/26/2017 1534   AST 31 07/26/2017 1534   ALT 31 07/26/2017 1534   ALKPHOS 78 07/26/2017 1534   BILITOT 0.8 07/26/2017 1534   GFRNONAA 80 07/26/2017 1534   GFRNONAA 84 12/11/2014 1119   GFRAA 92 07/26/2017 1534   GFRAA >89 12/11/2014 1119       Component Value Date/Time   WBC 10.4 07/26/2017 1534   WBC 9.9 12/11/2014 1130   RBC 5.61 07/26/2017 1534   RBC 5.69 12/11/2014 1130   HGB 18.0 (H) 07/26/2017 1534   HCT 51.8 (H) 07/26/2017 1534   PLT 296 07/26/2017 1534   MCV 92 07/26/2017 1534   MCH 32.1 07/26/2017 1534   MCH 29.3 12/11/2014 1130   MCHC 34.7 07/26/2017 1534   MCHC 31.7 (A) 12/11/2014 1130   RDW 13.7 07/26/2017 1534   LYMPHSABS 3.1 07/26/2017 1534   EOSABS 0.4 07/26/2017 1534   BASOSABS 0.0 07/26/2017 1534    No results found for: POCLITH, LITHIUM   No  results found for: PHENYTOIN, PHENOBARB, VALPROATE, CBMZ   Low B12 and bioavailable testosterone  .res Assessment: Plan:    Major depressive disorder, recurrent episode, moderate (HCC)  Mixed obsessional thoughts and acts  Insomnia due to mental condition  Obstructive sleep apnea  Weakness of both legs  Weakness of both arms  Rule out excessive sedation fatigue from medication  There is also question whether there may be an underlying bipolar predisposition because of his manic symptoms that he had around December 2018 which were quite dramatic according to his wife.  However it was at a time when he had abruptly discontinued paroxetine.  Greater than 50% of 30 min face to face time with patient was spent on counseling and coordination of care. We discussed the following.  Mild-moderate depression and OCD  managed.  Extensive discussion Of CPAP. He's at 50% use and sees some benefit. Use the app.  Rec he use more. Disc humidity of CPAP.  Disc adjusting ramp time. Use Flonase nightly  Consider lamotrigine, lithium, Vraylar, Rexulti.    Encourage reading and hobby development for relapse prevention. Try to stay busy bc lack of activity worsens his OCD.  Discussed potential metabolic side effects associated with atypical antipsychotics, as well as potential risk for movement side effects. Advised pt to contact office if movement side effects occur.  There is none evident at this time.  Continue the  Lorazepam 2 HS which worked for lseep before.  Continue those as you are now. We discussed the short-term risks associated with benzodiazepines including sedation and increased fall risk among others.  Discussed long-term side effect risk including dependence, potential withdrawal symptoms, and the potential eventual dose-related risk of dementia.  Use LED BZ for sleep  Continue vitamin D to 4000- 5000 Units daily Disc mental health benefit of vitamin d.  Just finished b12 shots.  Start oral.  To be sure meds are not contributing to fatigue or lack of motivation. Continue Reduced Vraylar to 1 capsule every other day Continue Reduced sertraline 1 and 1/2 tablets daily  Call if gets more  OK return to Viagra prn.  Disc SE  Follow-up in 2 months  Meredith Staggers MD, DFAPA  Please see After Visit Summary for patient specific instructions.  Future Appointments  Date Time Provider Department Center  05/22/2021  9:30 AM Levert Feinstein, MD [facility] None    No orders of the defined types were placed in this encounter.      -------------------------------

## 2021-05-02 ENCOUNTER — Other Ambulatory Visit: Payer: Self-pay | Admitting: Psychiatry

## 2021-05-02 DIAGNOSIS — F5105 Insomnia due to other mental disorder: Secondary | ICD-10-CM

## 2021-05-22 ENCOUNTER — Encounter: Payer: Self-pay | Admitting: Neurology

## 2021-05-22 ENCOUNTER — Ambulatory Visit: Payer: BC Managed Care – PPO | Admitting: Neurology

## 2021-05-22 VITALS — BP 166/100 | HR 98 | Ht 66.0 in | Wt 196.0 lb

## 2021-05-22 DIAGNOSIS — R269 Unspecified abnormalities of gait and mobility: Secondary | ICD-10-CM

## 2021-05-22 DIAGNOSIS — R292 Abnormal reflex: Secondary | ICD-10-CM | POA: Diagnosis not present

## 2021-05-22 NOTE — Progress Notes (Signed)
Chief Complaint  Patient presents with   New Patient (Initial Visit)    Rm 15. Accompanied by wife, [name]. NP/Paper/[name]/Parkinsonian symptoms - on antipsychotic medication. Pt c/o bilateral hand tremors, bilateral leg weakness, and ticks.      ASSESSMENT AND PLAN  Mark Estrada is a 66 y.o. male   Gait abnormality  Overall has improved with increased daily activity,  on examination, he does have hyperreflexia, bilateral Babinski signs,  no parkinsonian signs, has been on long antidepression or other neuropsychiatric medications His gait abnormality are likely combination of aging, deconditioning, worsening mood disorder  I have suggested further evaluation such as MRI of the brain, and cervical spine, he declines now concerning about the financial cost,  Encouraging continue moderate exercise,  Will contact the clinic for worsening symptoms   DIAGNOSTIC DATA (LABS, IMAGING, TESTING) - I reviewed patient records, labs, notes, testing and imaging myself where available.   MEDICAL HISTORY:  Mark Estrada, is a 66 year old male is a 66 years old male, seen in request by his primary care physician Dr.   [name], [name] for evaluation of gait abnormality, initial evaluation was May 22, 2021.   I reviewed and summarized the referring note. PMHx Depression, anxiety,  Chronic insomnia Obsessive compulsive disorder.  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, he denies REM sleep disorder, has obstructive sleep apnea, but could not tolerate CPAP not compliant with  it, denied loss sense of smell, denies significant memory loss, has mild bilateral hands action tremor,  He was noted to have gradual onset of gait abnormality over the past couple years, much improved with physical therapy, was also diagnosed with vitamin B12, vitamin D deficiency, with supplement, he feels much better  During his 1 episode of severe mood disorder, 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  PHYSICAL EXAMNIATION:  Gen: NAD, conversant, well nourised, well groomed                     Cardiovascular: Regular rate rhythm, no peripheral edema, warm, nontender. Eyes: Conjunctivae clear without exudates or hemorrhage Neck: Supple, no carotid bruits. Pulmonary: Clear to auscultation bilaterally   NEUROLOGICAL EXAM:  MENTAL STATUS: Anxious looking middle-age male  Speech:    Speech is normal; fluent and spontaneous with normal comprehension.  Cognition:     Orientation to time, place and person     Normal recent and remote memory     Normal Attention span and concentration     Normal Language, naming, repeating,spontaneous speech     Fund of knowledge   CRANIAL NERVES: CN II: Visual fields are full to confrontation. Pupils are round equal and briskly  reactive to light. CN III, IV, VI: extraocular movement are normal. No ptosis. CN V: Facial sensation is intact to light touch CN VII: Face is symmetric with normal eye closure  CN VIII: Hearing is normal to causal conversation. CN IX, X: Phonation is normal. CN XI: Head turning and shoulder shrug are intact  MOTOR: There is no pronator drift of  out-stretched arms. Muscle bulk and tone are normal. Muscle strength is normal.  REFLEXES: Reflexes are 3 and symmetric at the biceps, triceps, knees, and ankles. Plantar responses are flexor extensor bilaterally  SENSORY: Intact to light touch, pinprick and vibratory sensation are intact in fingers and toes.  COORDINATION: There is no trunk or limb dysmetria noted.  GAIT/STANCE: He needs push-up to get up from seated position, leg apart, steady,  REVIEW OF SYSTEMS:  Full 14 system review of systems performed and notable only for as above All other review of systems were negative.   ALLERGIES: Allergies  Allergen Reactions   Erythromycin Hives    Itching and hives  Other reaction(s): Fever   Azithromycin    Erythromycin Base Itching    HOME MEDICATIONS: Current Outpatient Medications  Medication Sig Dispense Refill   cariprazine (VRAYLAR) 1.5 MG capsule Take 1 capsule (1.5 mg total) by mouth daily. (Patient taking differently: Take 1.5 mg by mouth daily. Every other day) 30 capsule 5   Cyanocobalamin (B-12 PO) Take 1 tablet by mouth.     Ergocalciferol (VITAMIN D2 PO) Take by mouth.     LORazepam (ATIVAN) 1 MG tablet TAKE 2 TABLETS(2 MG) BY MOUTH AT BEDTIME 60 tablet 0   sertraline (ZOLOFT) 100 MG tablet TAKE 2 TABLETS(200 MG) BY MOUTH DAILY (Patient taking differently: Take 150 mg by mouth daily.) 180 tablet 1   No current facility-administered medications for this visit.    PAST MEDICAL HISTORY: Past Medical History:  Diagnosis Date   Anxiety    Depression    Muscle weakness (generalized)    Nonalcoholic steatohepatitis (NASH)    OCD (obsessive compulsive disorder) 1998   Other specified forms of tremor    Prostatodynia syndrome    Radiculopathy, lumbar region    Somnolence     PAST SURGICAL HISTORY: Past Surgical History:  Procedure Laterality Date   APPENDECTOMY     TONSILLECTOMY AND ADENOIDECTOMY      FAMILY HISTORY: Family History  Problem  Relation Age of Onset   Heart disease Father    Hypertension Father    Heart attack Father    Hyperlipidemia Brother     SOCIAL HISTORY: Social History   Socioeconomic History   Marital status: Married    Spouse name: [name]   Number of children: 1   Years of education: Master's   Highest education level: Not on file  Occupational History   Occupation: History Teacher    Comment: Psychiatrist  Tobacco Use   Smoking status: Never   Smokeless tobacco: Never  Substance and Sexual Activity   Alcohol use: No   Drug use: No   Sexual activity: Not on file  Other Topics Concern   Not on file  Social History Narrative   Lives with his wife and their daughter.   Social Determinants of Health   Financial Resource Strain: Not on file  Food Insecurity: Not on file  Transportation Needs: Not on file  Physical Activity: Not on file  Stress: Not on file  Social Connections: Not on file  Intimate Partner Violence: Not on file  Levert Feinstein, M.D. Ph.D.  [facility] [address] [place], [place] [address] Ph: [phone] Fax: [phone]  CC:  Dois Davenport, MD [address] [place],  [place] [address]  Dois Davenport, MD

## 2021-05-29 ENCOUNTER — Other Ambulatory Visit: Payer: Self-pay

## 2021-05-29 ENCOUNTER — Encounter: Payer: Self-pay | Admitting: Psychiatry

## 2021-05-29 ENCOUNTER — Ambulatory Visit: Payer: BC Managed Care – PPO | Admitting: Psychiatry

## 2021-05-29 DIAGNOSIS — F331 Major depressive disorder, recurrent, moderate: Secondary | ICD-10-CM | POA: Diagnosis not present

## 2021-05-29 DIAGNOSIS — F422 Mixed obsessional thoughts and acts: Secondary | ICD-10-CM

## 2021-05-29 DIAGNOSIS — F5105 Insomnia due to other mental disorder: Secondary | ICD-10-CM

## 2021-05-29 DIAGNOSIS — G4733 Obstructive sleep apnea (adult) (pediatric): Secondary | ICD-10-CM

## 2021-05-29 DIAGNOSIS — R29898 Other symptoms and signs involving the musculoskeletal system: Secondary | ICD-10-CM

## 2021-05-29 DIAGNOSIS — F330 Major depressive disorder, recurrent, mild: Secondary | ICD-10-CM

## 2021-05-29 MED ORDER — LORAZEPAM 1 MG PO TABS: 2.0000 mg | ORAL_TABLET | Freq: Every day | ORAL | 3 refills | Status: DC

## 2021-05-29 MED ORDER — SERTRALINE HCL 100 MG PO TABS: 150.0000 mg | ORAL_TABLET | Freq: Every day | ORAL | 1 refills | Status: DC

## 2021-05-29 MED ORDER — CARIPRAZINE HCL 1.5 MG PO CAPS: 1.5000 mg | ORAL_CAPSULE | Freq: Every day | ORAL | 2 refills | Status: DC

## 2021-05-29 NOTE — Progress Notes (Signed)
Mark Estrada Blanchette [id number] 06/01/1955 66 y.o.  Subjective:   Patient ID:  Mark Estrada is a 66 y.o. (DOB 02/04/1956) male.  Chief Complaint:  Chief Complaint  Patient presents with   Follow-up    Mixed obsessional thoughts and acts   Depression   Sleeping Problem   Anxiety    HPI: Mark Estrada presents to the office today for follow-up of unmanaged depression and OCD.  He has been fairly unstable for several months.  And at visit May 21, 2018 we increased sertraline from 150 to 200 mg a day  And then 250 about a month ago.  wean off the clomipramine since it did not seem necessary to continue both.  in April when it was recommended he increase sertraline to 300 mg and Abilify to 10 mg daily.  When seen September 2020.  The following adjustments were made: Continue sertraline  300 mg (1 in AM and 2 HS).  Discussed the higher than usual dosage and studies that support this.  It has helped the OCD but still a lot of general worry. Stop Abilify to see if it's causing fatigue. If depression or anxiety worsens, then start samples of Rexulti 1 mg for 1 week, then 2 mg daily.  He never took Rexulti.    W wants him to get a 2nd opinion.  Disc this.  Her concern is he is not himself, gloomy and low energy and motivation.  At his appointment in September he was encouraged again with his wife's presents to get a second opinion about his treatment.  seen February 09, 2019. Energy no better off Abilify and gradually depression and anxiety got worse.  Mind racing at night interferes with sleep and gets 2-3 hours of sleep.  Repeated awakenings.  Continued obsessions and depression. Since the increase in sertraline to 300 mg had not been additionally helpful it was reduced to 250 mg daily.  He was started off label Vraylar 1.5 mg daily because of some mood benefit from the Abilify but insufficient effects.  seen Feb 26, 2019.  Sertraline was reduced to 200 mg daily due to  diarrhea and tiredness and to see if motivation was improved and because of no apparent additional benefit at 300 mg daily.  He was continued on Vraylar 1.5 mg daily which he's now been on almost 6 weeks and lorazepam 2 mg nightly . A little more energy with the Vraylar.  Mild improvement in home activity level.  Wife hasn't said if she notices.  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  Hopes to do it the fullly allowed 3 1/2 days/week.  Loose  BM , no cramps once daily.  Sleep usually ok with lorazepam.  Better with hygiene.  Last couple of years periods of mania with excessive spending and hyperverbal speech.  He never spends money.  This happened when he retired.  Talked pressured and lasted a month or more.  She thinks it happened 2 years ago after  [place] stopping some meds.  seen April 01, 2019.  He was continued on sertraline 200 mg daily and Vraylar 1.5 mg for a longer trial of the Vraylar.   seen March 2021 with the following noted: No meds were changed. Thinks Vraylar is helping some.  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  OCD not bothering him that much.  Mood is a little better with Vraylar.  No SE.  Sleep pretty good and normal hours.  Not a  lot of things to enjoy.  Never really had hobbies.    08/27/2019 appointment with the following noted: About the same with maybe slight improvement.  Still a little more anxiety than depression.  OCD is manageable.  Still feels the Leafy Kindle is helpful.  No SE problems. 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 Mental health is pretty good.  Leafy Kindle is helping with anxiety and mood stability.  Handles stress better without flying off the handle.  No SE with it. Still some diarrhea but manageable.. Sleep 6-7 hours with Ativan 2 mg.  Drowsy daytime. OCD is pretty good overall with minimal sx except obs over  finances. D with assumed TBI costing money for rehab.  Took her out of school for the semester.  Fleet Contras also has a developmental delay so it's difficult to evaluate.  School is really hard for her.  Attends [employer].  Likes watching old TV shows.  Enjoys sub teaching.  Wife says he needs friends.   Pt reports that mood is flat and less anxious and obsessive.  Pt reports no sleep issues and 7 hours with Ativan 2 mg. Pt reports that appetite is good. Pt reports that energy is is a lot better and less anhedonia, loss of interest  poor motivation and more active. Concentration is better. Suicidal thoughts:  denied by patient and occ death thoughts. Also some cognitive problems with mistakes and oversights that are unusual for him. Less obsessing on health and more on money/job.  Plan: no med changes  05/18/2020 appointment with the following noted: "Doing a lot better".  Almost no checking and less .depression and anxiety. Pleased with meds.   Dx OSA  CPAP  is difficult using for 3 weeks.  Nostrils getting stopped up.   Never had sleep study. Snores loudly. Drowsy daytime. No HA.  Doesn't fall asleep driving but can fall asleep at work if it's quiet.  Fatigue.  Family says they suspect OSA bc loud snoring and stops breathing. Wakes himself up snoring. Plan: No med changes today.    08/17/2020 appointment with the following noted: Still struggling with CPAP bc claustrophobic.  Start off OK but some nights pushes away. Using about 50% of time. Pretty good and steady with depression and OCD.  Not much time spent with OCD.  Little OCD but some general depression at times. Some days when don't want to get out of chair.  Pushes himself to get out.  Does ok as long as has something to do. Stressful time of year. Sleep hours pretty good.  Cat awakens him but goes back to sleep. SE none.  01/17/21 appt noted: with wife on phone Weakness arms and legs getting worse for 2 years.  No pain, cramping,  numbness but legs give out and can't hold himself up at times.  Weakness waxes and wanes.   Saw PCP Dr. [name] and referred to neurology. Little physical activity for mos.   Wife notes his memory is slipping and mental processing of things like menus at restaurants. Wife thinks he's stable with OCD and depression but low energy and motivation.  Always been a workaholic and completely change. Not using CPAP. Not showering or brushing teeth properly.  He admits to no motivation. Plan: To be sure meds are not contributing to fatigue or lack of motivation. Reduce Vraylar to 1 capsule every other day Reduce  sertraline 1 and 1/2 tablets daily  03/28/2021 appointment with the following noted: Awaiting neurology appt. Doing PT and is getting a lot better.  Better with walking and endurance.  Legs stronger.   Still struggling with showering as regular as should but is brushing teeth. I wouldn't say depressed but somewhere in the middle. Little things bother him and either get better or worse.  Example wanted to substitute and no jobs evident.   Interest is 5-6/10.  Not doing much for fun except reading a lot and enjoying it.   No problems with less meds so far.   Sleep well with lorazepam. Plan: To be sure meds are not contributing to fatigue or lack of motivation. Continue Reduced Vraylar to 1 capsule every other day Continue Reduced sertraline 1 and 1/2 tablets daily Continue lorazepam 2 mg nightly  05/29/2021 appointment with the following noted: PT helped a lot.  Not keeping up exercise.  B12 did a lot to help and D3.  Starting Test Walking better makes him feel better emotionally but still up and down emotionally.   OCD minimal checking or obsessions.  Handles triggers well. W says he is constantly on the phone reading news and things but he says it's not a problem.. Wife says affect is much better and less flat since reduction in dosages.  Family noticed better too. 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   Did some cutting with chainsaw.   No SE. Urology starting testosterone.  Other psych med trials include: Clomipramine helped the depression but not the OCD,  Paroxetine 80,  Sertraline 300  Sertraline started early December 2019 Fluvoxamine (both of which eventually failed even at high dosage)  Olanzapine,  Quetiapine,  Aripiprazole,  Risperidone 1 mg Dec 2018 SE loopy  Never took Rexulti Vraylar 1.5 mg daily helped for sleep trazodone 50 which failed and temazepam and zolpidem which caused amnesia, lorazepam 2 mg HS    Review of Systems:  Review of Systems  Cardiovascular:  Negative for chest pain and palpitations.  Gastrointestinal:  Positive for diarrhea. Negative for abdominal distention, abdominal pain, constipation and nausea.       Heartburn resolved  Neurological:  Negative for tremors.  Psychiatric/Behavioral:  Positive for decreased concentration, dysphoric mood and sleep disturbance. Negative for agitation, behavioral problems, confusion, hallucinations, self-injury and suicidal ideas. The patient is nervous/anxious. The patient is not hyperactive.    Medications: I have reviewed the patient's current medications.  Current Outpatient Medications  Medication Sig Dispense Refill   Cyanocobalamin (B-12 PO) Take 1 tablet by mouth.     Ergocalciferol (VITAMIN D2 PO) Take by mouth.     cariprazine (VRAYLAR) 1.5 MG capsule Take 1 capsule (1.5 mg total) by mouth daily. 30 capsule 2   LORazepam (ATIVAN) 1 MG tablet Take 2 tablets (2 mg total) by mouth at bedtime. 60 tablet 3   sertraline (ZOLOFT) 100 MG tablet Take 1.5 tablets (150 mg total) by mouth daily. 135 tablet 1   No current facility-administered medications for this visit.    Medication Side Effects: Other: GI issues.  Miralax helped.  diarrhea  Allergies:  Allergies  Allergen Reactions   Erythromycin Hives    Itching and hives  Other reaction(s): Fever    Azithromycin    Erythromycin Base Itching    Past Medical History:  Diagnosis Date   Anxiety    Depression    Muscle weakness (generalized)    Nonalcoholic  steatohepatitis (NASH)    OCD (obsessive compulsive disorder) 1998   Other specified forms of tremor    Prostatodynia syndrome    Radiculopathy, lumbar region    Somnolence     Family History  Problem Relation Age of Onset   Heart disease Father    Hypertension Father    Heart attack Father    Hyperlipidemia Brother     Social History   Socioeconomic History   Marital status: Married    Spouse name: [name]   Number of children: 1   Years of education: Master's   Highest education level: Not on file  Occupational History   Occupation: History Teacher    Comment: [employer]  Tobacco Use   Smoking status: Never   Smokeless tobacco: Never  Substance and Sexual Activity   Alcohol use: No   Drug use: No   Sexual activity: Not on file  Other Topics Concern   Not on file  Social History Narrative   Lives with his wife and their daughter.   Social Determinants of Health   Financial Resource Strain: Not on file  Food Insecurity: Not on file  Transportation Needs: Not on file  Physical Activity: Not on file  Stress: Not on file  Social Connections: Not on file  Intimate Partner Violence: Not on file    Past Medical History, Surgical history, Social history, and Family history were reviewed and updated as appropriate.   Please see review of systems for further details on the patient's review from today.   Objective:   Physical Exam:  There were no vitals taken for this visit.  Physical Exam Constitutional:      General: He is not in acute distress.    Appearance: He is well-developed.  Musculoskeletal:        General: No deformity.  Neurological:     Mental Status: He is alert and oriented to person, place, and time.     Cranial Nerves: No dysarthria.     Motor: No weakness.      Coordination: Coordination normal.     Gait: Gait normal.     Comments: No  cane  Psychiatric:        Attention and Perception: Attention and perception normal. He does not perceive auditory or visual hallucinations.        Mood and Affect: Mood is depressed. Mood is not anxious. Affect is not labile, blunt, angry or inappropriate.        Speech: Speech normal. Speech is not slurred.        Behavior: Behavior normal. Behavior is cooperative.        Thought Content: Thought content is not paranoid or delusional. Thought content does not include homicidal or suicidal ideation. Thought content does not include suicidal plan.        Cognition and Memory: Cognition normal. He does not exhibit impaired recent memory.        Judgment: Judgment normal.     Comments: OCD very well controlled. Minimal depression Better affect    Lab Review:     Component Value Date/Time   NA 142 07/26/2017 1534   K 4.6 07/26/2017 1534   CL 100 07/26/2017 1534   CO2 22 07/26/2017 1534   GLUCOSE 109 (H) 07/26/2017 1534   GLUCOSE 99 12/11/2014 1119   BUN 11 07/26/2017 1534   CREATININE 1.02 07/26/2017 1534   CREATININE 0.99 12/11/2014 1119   CALCIUM 10.4 (H) 07/26/2017 1534   PROT 8.4 07/26/2017 1534  ALBUMIN 5.0 (H) 07/26/2017 1534   AST 31 07/26/2017 1534   ALT 31 07/26/2017 1534   ALKPHOS 78 07/26/2017 1534   BILITOT 0.8 07/26/2017 1534   GFRNONAA 80 07/26/2017 1534   GFRNONAA 84 12/11/2014 1119   GFRAA 92 07/26/2017 1534   GFRAA >89 12/11/2014 1119       Component Value Date/Time   WBC 10.4 07/26/2017 1534   WBC 9.9 12/11/2014 1130   RBC 5.61 07/26/2017 1534   RBC 5.69 12/11/2014 1130   HGB 18.0 (H) 07/26/2017 1534   HCT 51.8 (H) 07/26/2017 1534   PLT 296 07/26/2017 1534   MCV 92 07/26/2017 1534   MCH 32.1 07/26/2017 1534   MCH 29.3 12/11/2014 1130   MCHC 34.7 07/26/2017 1534   MCHC 31.7 (A) 12/11/2014 1130   RDW 13.7 07/26/2017 1534   LYMPHSABS 3.1 07/26/2017 1534   EOSABS 0.4  07/26/2017 1534   BASOSABS 0.0 07/26/2017 1534    No results found for: POCLITH, LITHIUM   No results found for: PHENYTOIN, PHENOBARB, VALPROATE, CBMZ   Low B12 and bioavailable testosterone  .res Assessment: Plan:    Major depressive disorder, recurrent episode, moderate (HCC)  Mixed obsessional thoughts and acts - Plan: cariprazine (VRAYLAR) 1.5 MG capsule, sertraline (ZOLOFT) 100 MG tablet  Insomnia due to mental condition - Plan: LORazepam (ATIVAN) 1 MG tablet  Obstructive sleep apnea  Weakness of both legs  Weakness of both arms  Depression, major, recurrent, mild (HCC) - Plan: cariprazine (VRAYLAR) 1.5 MG capsule, sertraline (ZOLOFT) 100 MG tablet  Rule out excessive sedation fatigue from medication  There is also question whether there may be an underlying bipolar predisposition because of his manic symptoms that he had around December 2018 which were quite dramatic according to his wife.  However it was at a time when he had abruptly discontinued paroxetine.  Greater than 50% of 30 min face to face time with patient was spent on counseling and coordination of care. We discussed the following.  Mild-moderate depression and OCD managed.  Extensive discussion Of CPAP. He's at 50% use and sees some benefit. Use the app.  Rec he use more. Disc humidity of CPAP.  Disc adjusting ramp time. Use Flonase nightly  Consider lamotrigine, lithium, Vraylar, Rexulti.    Encourage reading and hobby development for relapse prevention. Try to stay busy bc lack of activity worsens his OCD.  Discussed potential metabolic side effects associated with atypical antipsychotics, as well as potential risk for movement side effects. Advised pt to contact office if movement side effects occur.  There is none evident at this time.  Continue the  Lorazepam 2 HS which worked for lseep before.  Continue those as you are now. We discussed the short-term risks associated with benzodiazepines  including sedation and increased fall risk among others.  Discussed long-term side effect risk including dependence, potential withdrawal symptoms, and the potential eventual dose-related risk of dementia.  Use LED BZ for sleep  Continue vitamin D to 4000- 5000 Units daily Disc mental health benefit of vitamin d.  Just finished b12 shots.  Start oral.  To be sure meds are not contributing to fatigue or lack of motivation. Continue Reduced Vraylar to 1 capsule every other day Continue Reduced sertraline 1 and 1/2 tablets daily  Call if gets more  OK return to Viagra prn.  Disc SE  Follow-up in 4 months  Meredith Staggers MD, DFAPA  Please see After Visit Summary for patient specific instructions.  No future  appointments.   No orders of the defined types were placed in this encounter.      -------------------------------

## 2021-05-30 ENCOUNTER — Ambulatory Visit: Payer: BC Managed Care – PPO | Admitting: Psychiatry

## 2021-09-25 ENCOUNTER — Ambulatory Visit: Payer: BC Managed Care – PPO | Admitting: Psychiatry

## 2021-10-02 ENCOUNTER — Other Ambulatory Visit: Payer: Self-pay | Admitting: Psychiatry

## 2021-10-02 DIAGNOSIS — F330 Major depressive disorder, recurrent, mild: Secondary | ICD-10-CM

## 2021-10-02 DIAGNOSIS — F422 Mixed obsessional thoughts and acts: Secondary | ICD-10-CM

## 2021-10-03 ENCOUNTER — Other Ambulatory Visit: Payer: Self-pay | Admitting: Psychiatry

## 2021-10-03 DIAGNOSIS — F5105 Insomnia due to other mental disorder: Secondary | ICD-10-CM

## 2021-10-19 ENCOUNTER — Encounter: Payer: Self-pay | Admitting: Psychiatry

## 2021-10-19 ENCOUNTER — Ambulatory Visit: Payer: BC Managed Care – PPO | Admitting: Psychiatry

## 2021-10-19 DIAGNOSIS — F5105 Insomnia due to other mental disorder: Secondary | ICD-10-CM

## 2021-10-19 DIAGNOSIS — F331 Major depressive disorder, recurrent, moderate: Secondary | ICD-10-CM | POA: Diagnosis not present

## 2021-10-19 DIAGNOSIS — F330 Major depressive disorder, recurrent, mild: Secondary | ICD-10-CM

## 2021-10-19 DIAGNOSIS — G4733 Obstructive sleep apnea (adult) (pediatric): Secondary | ICD-10-CM | POA: Diagnosis not present

## 2021-10-19 DIAGNOSIS — F422 Mixed obsessional thoughts and acts: Secondary | ICD-10-CM | POA: Diagnosis not present

## 2021-10-19 MED ORDER — SERTRALINE HCL 100 MG PO TABS: 150.0000 mg | ORAL_TABLET | Freq: Every day | ORAL | 1 refills | Status: DC

## 2021-10-19 MED ORDER — LORAZEPAM 1 MG PO TABS: 2.0000 mg | ORAL_TABLET | Freq: Every day | ORAL | 5 refills | Status: DC
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
## 2021-11-03 ENCOUNTER — Other Ambulatory Visit: Payer: Self-pay | Admitting: Psychiatry

## 2021-11-03 DIAGNOSIS — F5105 Insomnia due to other mental disorder: Secondary | ICD-10-CM

## 2021-11-03 NOTE — Telephone Encounter (Signed)
Filled 7/11 appt 10/30

## 2021-12-07 ENCOUNTER — Telehealth: Payer: Self-pay | Admitting: Psychiatry

## 2021-12-07 NOTE — Telephone Encounter (Signed)
Wife called and said that Mark Estrada is experiencing oral mouth movements. He is clicking his teeth, pursing lipsand when he talks his mouth is stiff. This is just an fyi for dr. Jennelle Human. His wife doesn;t think that Mark Estrada will bring it up at next appt in october

## 2021-12-12 NOTE — Telephone Encounter (Signed)
Patient said the mouth movements are not such a concern that they can't wait until he sees Dr. Clovis Estrada in October.

## 2022-01-22 ENCOUNTER — Encounter: Payer: Self-pay | Admitting: Psychiatry

## 2022-01-22 ENCOUNTER — Ambulatory Visit: Payer: BC Managed Care – PPO | Admitting: Psychiatry

## 2022-01-22 DIAGNOSIS — F422 Mixed obsessional thoughts and acts: Secondary | ICD-10-CM

## 2022-01-22 DIAGNOSIS — G4733 Obstructive sleep apnea (adult) (pediatric): Secondary | ICD-10-CM | POA: Diagnosis not present

## 2022-01-22 DIAGNOSIS — F330 Major depressive disorder, recurrent, mild: Secondary | ICD-10-CM

## 2022-01-22 DIAGNOSIS — F5105 Insomnia due to other mental disorder: Secondary | ICD-10-CM

## 2022-01-22 DIAGNOSIS — F331 Major depressive disorder, recurrent, moderate: Secondary | ICD-10-CM

## 2022-01-22 DIAGNOSIS — E538 Deficiency of other specified B group vitamins: Secondary | ICD-10-CM

## 2022-01-22 DIAGNOSIS — G63 Polyneuropathy in diseases classified elsewhere: Secondary | ICD-10-CM

## 2022-01-22 MED ORDER — LORAZEPAM 1 MG PO TABS: 2.0000 mg | ORAL_TABLET | Freq: Every day | ORAL | 3 refills | Status: DC

## 2022-01-22 MED ORDER — SERTRALINE HCL 100 MG PO TABS: 150.0000 mg | ORAL_TABLET | Freq: Every day | ORAL | 1 refills | Status: DC

## 2022-01-22 NOTE — Patient Instructions (Signed)
(  NAC) N-Acetylcysteine 2 of the  600 mg capsules daily to help with mild cognitive problems and skin picking.
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
## 2022-04-24 ENCOUNTER — Ambulatory Visit (INDEPENDENT_AMBULATORY_CARE_PROVIDER_SITE_OTHER): Payer: Medicare PPO | Admitting: Psychiatry

## 2022-04-24 ENCOUNTER — Encounter: Payer: Self-pay | Admitting: Psychiatry

## 2022-04-24 DIAGNOSIS — F422 Mixed obsessional thoughts and acts: Secondary | ICD-10-CM

## 2022-04-24 DIAGNOSIS — F5105 Insomnia due to other mental disorder: Secondary | ICD-10-CM | POA: Diagnosis not present

## 2022-04-24 DIAGNOSIS — F330 Major depressive disorder, recurrent, mild: Secondary | ICD-10-CM

## 2022-04-24 DIAGNOSIS — G4733 Obstructive sleep apnea (adult) (pediatric): Secondary | ICD-10-CM | POA: Diagnosis not present

## 2022-04-24 DIAGNOSIS — F331 Major depressive disorder, recurrent, moderate: Secondary | ICD-10-CM

## 2022-04-24 DIAGNOSIS — G63 Polyneuropathy in diseases classified elsewhere: Secondary | ICD-10-CM

## 2022-04-24 DIAGNOSIS — E538 Deficiency of other specified B group vitamins: Secondary | ICD-10-CM

## 2022-04-24 MED ORDER — SERTRALINE HCL 100 MG PO TABS: 150.0000 mg | ORAL_TABLET | Freq: Every day | ORAL | 1 refills | Status: DC

## 2022-04-24 MED ORDER — LORAZEPAM 1 MG PO TABS: 2.0000 mg | ORAL_TABLET | Freq: Every day | ORAL | 4 refills | Status: DC
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
## 2022-08-22 ENCOUNTER — Ambulatory Visit: Payer: BC Managed Care – PPO | Admitting: Psychiatry

## 2022-10-04 ENCOUNTER — Encounter: Payer: Self-pay | Admitting: Psychiatry

## 2022-10-04 ENCOUNTER — Ambulatory Visit (INDEPENDENT_AMBULATORY_CARE_PROVIDER_SITE_OTHER): Payer: BC Managed Care – PPO | Admitting: Psychiatry

## 2022-10-04 DIAGNOSIS — G4733 Obstructive sleep apnea (adult) (pediatric): Secondary | ICD-10-CM

## 2022-10-04 DIAGNOSIS — F5105 Insomnia due to other mental disorder: Secondary | ICD-10-CM

## 2022-10-04 DIAGNOSIS — F331 Major depressive disorder, recurrent, moderate: Secondary | ICD-10-CM

## 2022-10-04 DIAGNOSIS — F422 Mixed obsessional thoughts and acts: Secondary | ICD-10-CM | POA: Diagnosis not present

## 2022-10-04 DIAGNOSIS — E538 Deficiency of other specified B group vitamins: Secondary | ICD-10-CM

## 2022-10-04 DIAGNOSIS — F330 Major depressive disorder, recurrent, mild: Secondary | ICD-10-CM

## 2022-10-04 DIAGNOSIS — G63 Polyneuropathy in diseases classified elsewhere: Secondary | ICD-10-CM

## 2022-10-04 MED ORDER — SERTRALINE HCL 100 MG PO TABS
150.0000 mg | ORAL_TABLET | Freq: Every day | ORAL | 2 refills | Status: DC
Start: 2022-10-04 — End: 2023-02-25

## 2022-10-04 MED ORDER — LORAZEPAM 1 MG PO TABS
2.0000 mg | ORAL_TABLET | Freq: Every day | ORAL | 5 refills | Status: DC
Start: 2022-10-04 — End: 2023-04-05
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
## 2023-02-23 ENCOUNTER — Other Ambulatory Visit: Payer: Self-pay | Admitting: Psychiatry

## 2023-02-23 DIAGNOSIS — F330 Major depressive disorder, recurrent, mild: Secondary | ICD-10-CM

## 2023-02-23 DIAGNOSIS — F422 Mixed obsessional thoughts and acts: Secondary | ICD-10-CM

## 2023-04-05 ENCOUNTER — Other Ambulatory Visit: Payer: Self-pay | Admitting: Psychiatry

## 2023-04-05 DIAGNOSIS — F5105 Insomnia due to other mental disorder: Secondary | ICD-10-CM

## 2023-07-04 ENCOUNTER — Encounter: Payer: Self-pay | Admitting: Psychiatry

## 2023-07-04 ENCOUNTER — Ambulatory Visit: Payer: Medicare PPO | Admitting: Psychiatry

## 2023-07-04 DIAGNOSIS — F5105 Insomnia due to other mental disorder: Secondary | ICD-10-CM

## 2023-07-04 DIAGNOSIS — F331 Major depressive disorder, recurrent, moderate: Secondary | ICD-10-CM | POA: Diagnosis not present

## 2023-07-04 DIAGNOSIS — F422 Mixed obsessional thoughts and acts: Secondary | ICD-10-CM | POA: Diagnosis not present

## 2023-07-04 DIAGNOSIS — G4733 Obstructive sleep apnea (adult) (pediatric): Secondary | ICD-10-CM

## 2023-07-04 DIAGNOSIS — E538 Deficiency of other specified B group vitamins: Secondary | ICD-10-CM

## 2023-07-04 DIAGNOSIS — G63 Polyneuropathy in diseases classified elsewhere: Secondary | ICD-10-CM

## 2023-07-04 DIAGNOSIS — F330 Major depressive disorder, recurrent, mild: Secondary | ICD-10-CM

## 2023-07-04 MED ORDER — LORAZEPAM 1 MG PO TABS
2.0000 mg | ORAL_TABLET | Freq: Every day | ORAL | 5 refills | Status: AC
Start: 2023-07-04 — End: ?

## 2023-07-04 MED ORDER — SERTRALINE HCL 100 MG PO TABS: 150.0000 mg | ORAL_TABLET | Freq: Every day | ORAL | 3 refills | Status: DC
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
## 2024-01-10 ENCOUNTER — Telehealth: Payer: Self-pay | Admitting: Psychiatry

## 2024-01-10 DIAGNOSIS — F5105 Insomnia due to other mental disorder: Secondary | ICD-10-CM

## 2024-01-10 NOTE — Telephone Encounter (Signed)
 Patient came in to schedule appt and request refill for Lorazepam  1mg . Ph: [phone] Appt 12/23 Pharmacy [facility] [address]

## 2024-01-10 NOTE — Telephone Encounter (Signed)
 RF has already been pended to provider.

## 2024-03-17 ENCOUNTER — Encounter: Payer: Self-pay | Admitting: Psychiatry

## 2024-03-17 ENCOUNTER — Ambulatory Visit: Admitting: Psychiatry

## 2024-03-17 DIAGNOSIS — G4733 Obstructive sleep apnea (adult) (pediatric): Secondary | ICD-10-CM | POA: Diagnosis not present

## 2024-03-17 DIAGNOSIS — G63 Polyneuropathy in diseases classified elsewhere: Secondary | ICD-10-CM

## 2024-03-17 DIAGNOSIS — F330 Major depressive disorder, recurrent, mild: Secondary | ICD-10-CM | POA: Diagnosis not present

## 2024-03-17 DIAGNOSIS — F5105 Insomnia due to other mental disorder: Secondary | ICD-10-CM

## 2024-03-17 DIAGNOSIS — E538 Deficiency of other specified B group vitamins: Secondary | ICD-10-CM | POA: Diagnosis not present

## 2024-03-17 DIAGNOSIS — F331 Major depressive disorder, recurrent, moderate: Secondary | ICD-10-CM

## 2024-03-17 DIAGNOSIS — F422 Mixed obsessional thoughts and acts: Secondary | ICD-10-CM

## 2024-03-17 MED ORDER — LORAZEPAM 2 MG PO TABS: 2.0000 mg | ORAL_TABLET | Freq: Every day | ORAL | 5 refills | Status: AC

## 2024-03-17 MED ORDER — SERTRALINE HCL 100 MG PO TABS: 150.0000 mg | ORAL_TABLET | Freq: Every day | ORAL | 3 refills | Status: AC
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
## 2024-09-14 ENCOUNTER — Ambulatory Visit: Admitting: Psychiatry
